# Patient Record
Sex: Male | Born: 1995 | Race: White | Hispanic: No | Marital: Married | Smoking: Former smoker
Health system: Southern US, Community
[De-identification: ages and names within clinical notes are randomized; demographics above are authoritative.]

## PROBLEM LIST (undated history)

## (undated) DIAGNOSIS — F419 Anxiety disorder, unspecified: Secondary | ICD-10-CM

## (undated) DIAGNOSIS — F319 Bipolar disorder, unspecified: Secondary | ICD-10-CM

## (undated) DIAGNOSIS — F909 Attention-deficit hyperactivity disorder, unspecified type: Secondary | ICD-10-CM

## (undated) HISTORY — DX: Attention-deficit hyperactivity disorder, unspecified type: F90.9

## (undated) HISTORY — PX: NO PAST SURGERIES: SHX2092

## (undated) HISTORY — DX: Anxiety disorder, unspecified: F41.9

---
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
## 2015-03-25 IMAGING — CR DG ABDOMEN 1V
1 series · 1 of 1 positions shown · non-contrast
Comparison: None.

CLINICAL DATA: Epigastric abdominal pain.

EXAM:
ABDOMEN - 1 VIEW

[dxr kidney ureter bladder]
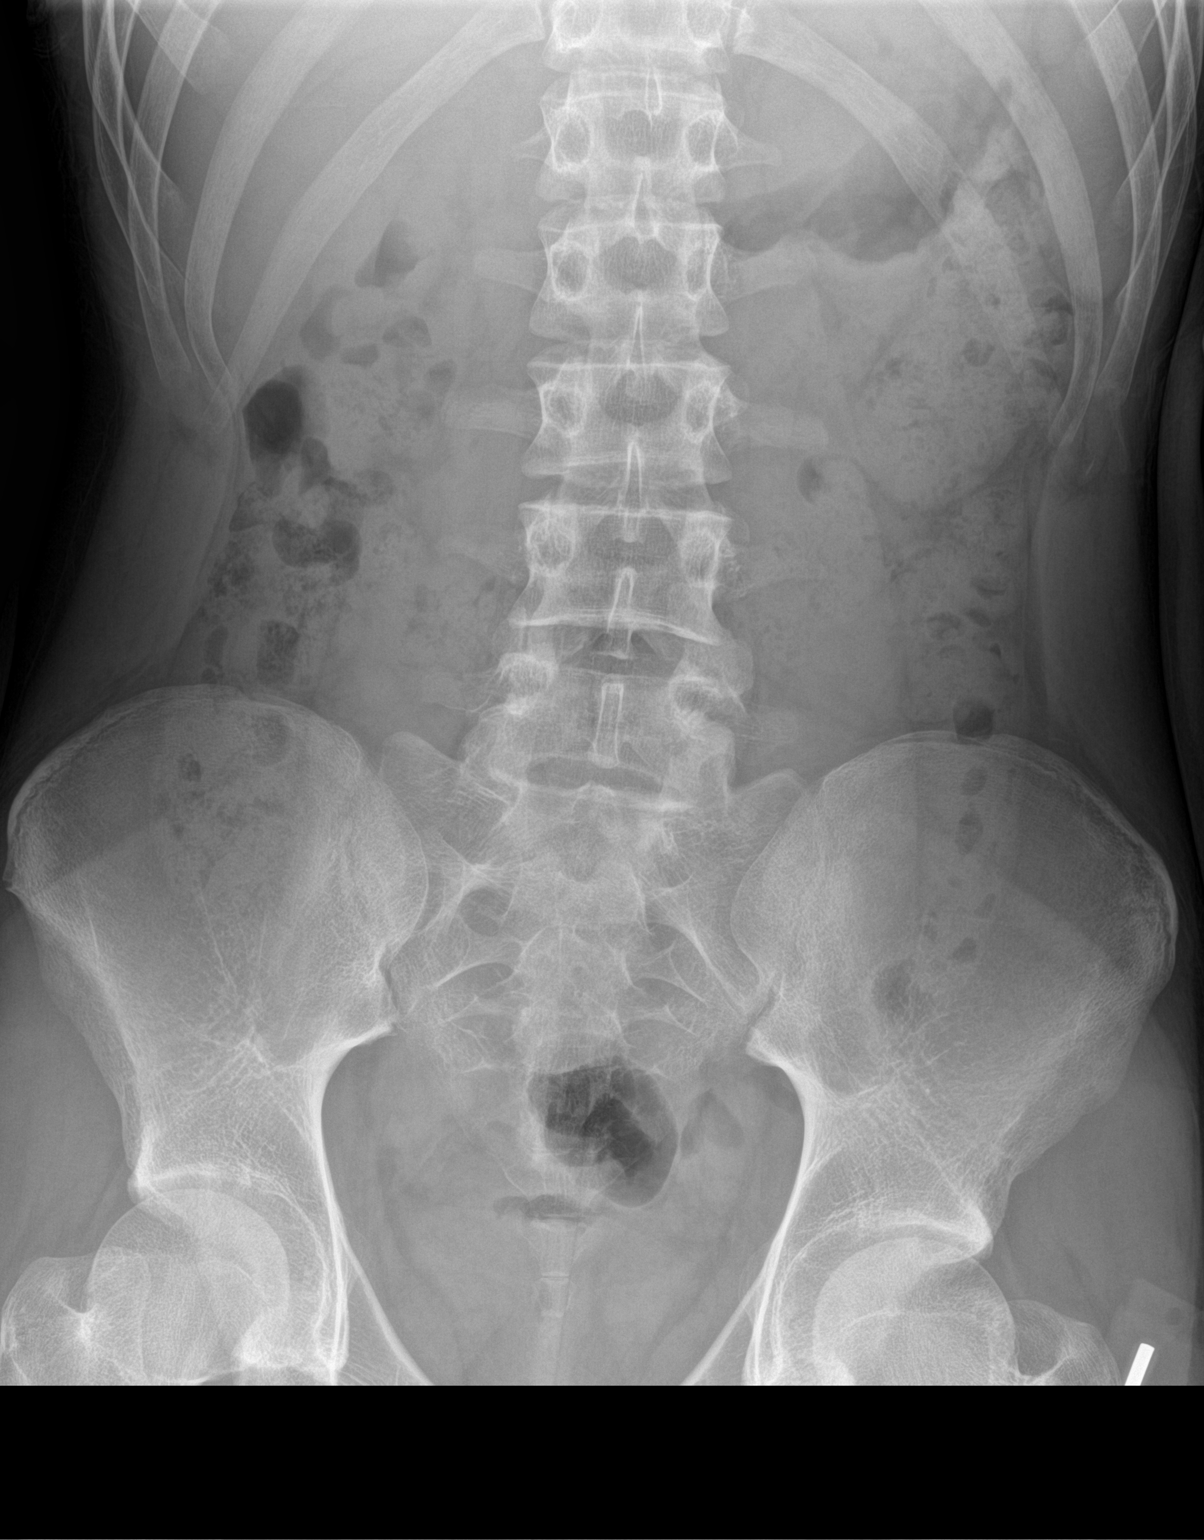

[1 of 1 positions shown; findings below may reference images not displayed]

FINDINGS: The bowel gas pattern is normal. No radio-opaque calculi or other
significant radiographic abnormality are seen. Moderate stool
burden.
IMPRESSION: Negative.

## 2016-04-19 ENCOUNTER — Ambulatory Visit
Admission: EM | Admit: 2016-04-19 | Discharge: 2016-04-19 | Disposition: A | Discharge status: Home / Self Care | Payer: 59 | Attending: Family Medicine | Admitting: Family Medicine

## 2016-04-19 DIAGNOSIS — J0100 Acute maxillary sinusitis, unspecified: Secondary | ICD-10-CM | POA: Diagnosis not present

## 2016-04-19 DIAGNOSIS — S71011A Laceration without foreign body, right hip, initial encounter: Secondary | ICD-10-CM | POA: Diagnosis not present

## 2016-04-19 HISTORY — DX: Bipolar disorder, unspecified: F31.9

## 2016-04-19 MED ORDER — TETANUS-DIPHTH-ACELL PERTUSSIS 5-2.5-18.5 LF-MCG/0.5 IM SUSP
0.5000 mL | Freq: Once | INTRAMUSCULAR | Status: AC
Start: 2016-04-19 — End: 2016-04-19
  Administered 2016-04-19: 0.5 mL via INTRAMUSCULAR

## 2016-04-19 MED ORDER — AMOXICILLIN 875 MG PO TABS: 875.0000 mg | ORAL_TABLET | Freq: Two times a day (BID) | ORAL | 0 refills | Status: DC

## 2016-04-19 NOTE — ED Provider Notes (Signed)
[facility]    CSN: [id number] Arrival date & time: 04/19/16  1349     History   Chief Complaint Chief Complaint  Patient presents with  . Puncture Wound  . Sinusitis    HPI Stephen Downs is a 21 y.o. male.   HPI  Past Medical History:  Diagnosis Date  . Bipolar 1 disorder (HCC)     There are no active problems to display for this patient.   Past Surgical History:  Procedure Laterality Date  . NO PAST SURGERIES         Home Medications    Prior to Admission medications   Medication Sig Start Date End Date Taking? Authorizing Provider  divalproex (DEPAKOTE) 500 MG DR tablet Take 1,000-1,500 mg by mouth every evening.   Yes Historical Provider, MD  guanFACINE (INTUNIV) 4 MG TB24 SR tablet Take 4 mg by mouth daily.   Yes Historical Provider, MD  lamoTRIgine (LAMICTAL) 150 MG tablet Take 150 mg by mouth daily.   Yes Historical Provider, MD  amoxicillin (AMOXIL) 875 MG tablet Take 1 tablet (875 mg total) by mouth 2 (two) times daily. 04/19/16   Payton Mccallum, MD  Multiple Vitamin (MULTIVITAMIN WITH MINERALS) TABS tablet Take 1 tablet by mouth daily.    Historical Provider, MD    Family History History reviewed. No pertinent family history.  Social History Social History  Substance Use Topics  . Smoking status: Current Every Day Smoker    Packs/day: 0.75    Types: Cigarettes  . Smokeless tobacco: Never Used  . Alcohol use Yes     Comment: occasionally     Allergies   Patient has no known allergies.   Review of Systems Review of Systems   Physical Exam Triage Vital Signs ED Triage Vitals  Enc Vitals Group     BP 04/19/16 1419 (!) 109/58     Pulse Rate 04/19/16 1419 71     Resp 04/19/16 1419 16     Temp 04/19/16 1419 98.7 F (37.1 C)     Temp Source 04/19/16 1419 Oral     SpO2 04/19/16 1419 100 %     Weight 04/19/16 1420 148 lb (67.1 kg)     Height 04/19/16 1420 5\' 11"  (1.803 m)     Head Circumference --      Peak Flow  --      Pain Score 04/19/16 1422 10     Pain Loc --      Pain Edu? --      Excl. in GC? --    No data found.   Updated Vital Signs BP (!) 109/58 (BP Location: Left Arm)   Pulse 71   Temp 98.7 F (37.1 C) (Oral)   Resp 16   Ht 5\' 11"  (1.803 m)   Wt 148 lb (67.1 kg)   SpO2 100%   BMI 20.64 kg/m   Visual Acuity Right Eye Distance:   Left Eye Distance:   Bilateral Distance:    Right Eye Near:   Left Eye Near:    Bilateral Near:     Physical Exam  Constitutional: He appears well-developed and well-nourished. No distress.  HENT:  Head: Normocephalic and atraumatic.  Right Ear: Tympanic membrane, external ear and ear canal normal.  Left Ear: Tympanic membrane, external ear and ear canal normal.  Nose: Right sinus exhibits maxillary sinus tenderness. Right sinus exhibits no frontal sinus tenderness. Left sinus exhibits maxillary sinus tenderness. Left sinus exhibits no frontal sinus tenderness.  Mouth/Throat: Uvula is midline, oropharynx is clear and moist and mucous membranes are normal. No oropharyngeal exudate or tonsillar abscesses.  Eyes: Conjunctivae and EOM are normal. Pupils are equal, round, and reactive to light. Right eye exhibits no discharge. Left eye exhibits no discharge. No scleral icterus.  Neck: Normal range of motion. Neck supple. No tracheal deviation present. No thyromegaly present.  Cardiovascular: Normal rate, regular rhythm and normal heart sounds.   Pulmonary/Chest: Effort normal and breath sounds normal. No stridor. No respiratory distress. He has no wheezes. He has no rales. He exhibits no tenderness.  Lymphadenopathy:    He has no cervical adenopathy.  Neurological: He is alert.  Skin: Skin is warm and dry. No rash noted. He is not diaphoretic.  1 cm laceration to skin on right hip  Nursing note and vitals reviewed.    UC Treatments / Results  Labs (all labs ordered are listed, but only abnormal results are displayed) Labs Reviewed - No data  to display  EKG  EKG Interpretation None       Radiology No results found.  Procedures .Marland Kitchen.Laceration Repair Date/Time: 04/19/2016 3:48 PM Performed by: Payton MccallumONTY, Phelan Goers Authorized by: Payton MccallumONTY, Raney Koeppen   Consent:    Consent obtained:  Verbal   Consent given by:  Patient   Risks discussed:  Infection, pain, poor wound healing, poor cosmetic result, need for additional repair and retained foreign body   Alternatives discussed:  No treatment Anesthesia (see MAR for exact dosages):    Anesthesia method:  Local infiltration   Local anesthetic:  Lidocaine 1% w/o epi Laceration details:    Location:  Leg   Leg location:  R hip   Length (cm):  1 Repair type:    Repair type:  Simple Pre-procedure details:    Preparation:  Patient was prepped and draped in usual sterile fashion Exploration:    Hemostasis achieved with:  Direct pressure   Wound exploration: wound explored through full range of motion and entire depth of wound probed and visualized     Wound extent: no foreign bodies/material noted, no muscle damage noted, no nerve damage noted, no tendon damage noted, no underlying fracture noted and no vascular damage noted     Contaminated: no   Treatment:    Area cleansed with:  Betadine   Amount of cleaning:  Standard   Irrigation solution:  Sterile saline   Irrigation method:  Pressure wash Skin repair:    Repair method:  Sutures   Suture size:  5-0   Suture material:  Nylon   Suture technique:  Simple interrupted   Number of sutures:  2 Approximation:    Approximation:  Close Post-procedure details:    Dressing:  Antibiotic ointment   Patient tolerance of procedure:  Tolerated well, no immediate complications    (including critical care time)  Medications Ordered in UC Medications  Tdap (BOOSTRIX) injection 0.5 mL (0.5 mLs Intramuscular Given 04/19/16 1423)     Initial Impression / Assessment and Plan / UC Course  I have reviewed the triage vital signs and the  nursing notes.  Pertinent labs & imaging results that were available during my care of the patient were reviewed by me and considered in my medical decision making (see chart for details).       Final Clinical Impressions(s) / UC Diagnoses   Final diagnoses:  Laceration of right hip, initial encounter  Acute maxillary sinusitis, recurrence not specified    New Prescriptions Discharge Medication List as of 04/19/2016  3:28 PM    START taking these medications   Details  amoxicillin (AMOXIL) 875 MG tablet Take 1 tablet (875 mg total) by mouth 2 (two) times daily., Starting Sun 04/19/2016, Normal       1. diagnosis reviewed with patient 2. rx as per orders above; reviewed possible side effects, interactions, risks and benefits  3. Recommend supportive treatment with wound/laceration care (verbal and written information given) 4. Follow-up in 8 days for suture removal or sooner prn if any problems 5.  Follow up prn if symptoms worsen or don't improve   Payton Mccallum, MD 04/19/16 1556

## 2016-04-19 NOTE — ED Triage Notes (Signed)
Patient complains of cough, congestion, sinus pain and pressure, discolored mucus x 2 weeks.  Patient also presents with puncture wound to right hip with a knife while trying to open something. Patient states that he believes a small piece of skin is missing.

## 2019-04-03 ENCOUNTER — Other Ambulatory Visit: Payer: Self-pay

## 2019-04-03 ENCOUNTER — Ambulatory Visit (INDEPENDENT_AMBULATORY_CARE_PROVIDER_SITE_OTHER): Payer: PRIVATE HEALTH INSURANCE | Admitting: Family Medicine

## 2019-04-03 VITALS — BP 117/75 | HR 87 | Temp 98.7°F | Ht 71.0 in | Wt 146.0 lb

## 2019-04-03 DIAGNOSIS — Z024 Encounter for examination for driving license: Secondary | ICD-10-CM

## 2019-04-03 NOTE — Patient Instructions (Addendum)
Nice meeting you today.  If you can bring the rest of the paperwork I will review and complete my portion of the form.  I do recommend discussing ADD and potential treatment if needed with your psychiatrist.   Schedule appointment at a time convenient for you for a physical or establish care visit.  I am happy to talk about your family history and any specific recommended tests.  Let me know if there are questions meantime.    If you have lab work done today you will be contacted with your lab results within the next 2 weeks.  If you have not heard from [place] then please contact us. The fastest way to get your results is to register for My Chart.   IF you received an x-ray today, you will receive an invoice from [facility]. Please contact [facility] at [phone] with questions or concerns regarding your invoice.   IF you received labwork today, you will receive an invoice from [facility]. Please contact [facility] at [phone] with questions or concerns regarding your invoice.   Our billing staff will not be able to assist you with questions regarding bills from these companies.  You will be contacted with the lab results as soon as they are available. The fastest way to get your results is to activate your My Chart account. Instructions are located on the last page of this paperwork. If you have not heard from [place] regarding the results in 2 weeks, please contact this office.
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
## 2019-04-04 ENCOUNTER — Encounter: Payer: Self-pay | Admitting: Family Medicine

## 2019-04-05 ENCOUNTER — Telehealth: Payer: Self-pay | Admitting: Family Medicine

## 2019-04-05 NOTE — Telephone Encounter (Signed)
Pt dropped off paperwork for [facility] Physical to be completed by Dr.Greene from Previous visit/ place in provider /cma mailbox at nurses station

## 2019-04-12 ENCOUNTER — Telehealth: Payer: Self-pay | Admitting: Family Medicine

## 2019-04-12 NOTE — Telephone Encounter (Signed)
Pt. Called about DMV paperwork he left for provider to fill out. Please give a call when it is ready for pick up. [phone] Please advise.

## 2019-04-12 NOTE — Telephone Encounter (Signed)
Please Advise

## 2019-04-12 NOTE — Telephone Encounter (Signed)
Paperwork completed - ready for pickup. Let me know if there are questions.

## 2019-04-13 NOTE — Telephone Encounter (Signed)
Msg was left on patient machine that paperwork is ready for pick up.

## 2020-06-21 ENCOUNTER — Other Ambulatory Visit (HOSPITAL_BASED_OUTPATIENT_CLINIC_OR_DEPARTMENT_OTHER)
Admission: RE | Admit: 2020-06-21 | Discharge: 2020-06-21 | Disposition: A | Discharge status: Home / Self Care | Payer: 59 | Source: Ambulatory Visit | Attending: Nurse Practitioner | Admitting: Nurse Practitioner

## 2020-06-21 ENCOUNTER — Encounter (HOSPITAL_BASED_OUTPATIENT_CLINIC_OR_DEPARTMENT_OTHER): Payer: Self-pay | Admitting: Nurse Practitioner

## 2020-06-21 ENCOUNTER — Other Ambulatory Visit: Payer: Self-pay

## 2020-06-21 ENCOUNTER — Ambulatory Visit (INDEPENDENT_AMBULATORY_CARE_PROVIDER_SITE_OTHER): Payer: 59 | Admitting: Nurse Practitioner

## 2020-06-21 VITALS — BP 99/63 | HR 85 | Ht 71.0 in | Wt 143.0 lb

## 2020-06-21 DIAGNOSIS — R42 Dizziness and giddiness: Secondary | ICD-10-CM

## 2020-06-21 DIAGNOSIS — Z130 Encounter for screening for diseases of the blood and blood-forming organs and certain disorders involving the immune mechanism: Secondary | ICD-10-CM

## 2020-06-21 DIAGNOSIS — R202 Paresthesia of skin: Secondary | ICD-10-CM

## 2020-06-21 DIAGNOSIS — R200 Anesthesia of skin: Secondary | ICD-10-CM | POA: Insufficient documentation

## 2020-06-21 DIAGNOSIS — Z820 Family history of epilepsy and other diseases of the nervous system: Secondary | ICD-10-CM

## 2020-06-21 DIAGNOSIS — Z13228 Encounter for screening for other metabolic disorders: Secondary | ICD-10-CM

## 2020-06-21 DIAGNOSIS — Z0000 Encounter for general adult medical examination without abnormal findings: Secondary | ICD-10-CM | POA: Insufficient documentation

## 2020-06-21 DIAGNOSIS — Z131 Encounter for screening for diabetes mellitus: Secondary | ICD-10-CM

## 2020-06-21 DIAGNOSIS — Z13220 Encounter for screening for lipoid disorders: Secondary | ICD-10-CM

## 2020-06-21 DIAGNOSIS — Z7689 Persons encountering health services in other specified circumstances: Secondary | ICD-10-CM

## 2020-06-21 DIAGNOSIS — Z1321 Encounter for screening for nutritional disorder: Secondary | ICD-10-CM

## 2020-06-21 DIAGNOSIS — Z87898 Personal history of other specified conditions: Secondary | ICD-10-CM

## 2020-06-21 DIAGNOSIS — Z1329 Encounter for screening for other suspected endocrine disorder: Secondary | ICD-10-CM

## 2020-06-21 HISTORY — DX: Persons encountering health services in other specified circumstances: Z76.89

## 2020-06-21 HISTORY — DX: Dizziness and giddiness: R42

## 2020-06-21 HISTORY — DX: Anesthesia of skin: R20.0

## 2020-06-21 HISTORY — DX: Encounter for general adult medical examination without abnormal findings: Z00.00

## 2020-06-21 HISTORY — DX: Personal history of other specified conditions: Z87.898

## 2020-06-21 HISTORY — DX: Family history of epilepsy and other diseases of the nervous system: Z82.0

## 2020-06-21 LAB — LIPID PANEL
Cholesterol: 150 mg/dL (ref 0–200)
HDL: 49 mg/dL (ref >40)
LDL Cholesterol: 83 mg/dL (ref 0–99)
Total CHOL/HDL Ratio: 3.1 RATIO
Triglycerides: 89 mg/dL (ref <150)
VLDL: 18 mg/dL (ref 0–40)

## 2020-06-21 LAB — CBC WITH DIFFERENTIAL/PLATELET
Abs Immature Granulocytes: 0.01 10*3/uL (ref 0.00–0.07)
Basophils Absolute: 0.1 10*3/uL (ref 0.0–0.1)
Basophils Relative: 1 %
Eosinophils Absolute: 0.1 10*3/uL (ref 0.0–0.5)
Eosinophils Relative: 1 %
HCT: 47.2 % (ref 39.0–52.0)
Hemoglobin: 16.7 g/dL (ref 13.0–17.0)
Immature Granulocytes: 0 %
Lymphocytes Relative: 39 %
Lymphs Abs: 2.8 10*3/uL (ref 0.7–4.0)
MCH: 30.6 pg (ref 26.0–34.0)
MCHC: 35.4 g/dL (ref 30.0–36.0)
MCV: 86.6 fL (ref 80.0–100.0)
Monocytes Absolute: 0.5 10*3/uL (ref 0.1–1.0)
Monocytes Relative: 7 %
Neutro Abs: 3.8 10*3/uL (ref 1.7–7.7)
Neutrophils Relative %: 52 %
Platelets: 275 10*3/uL (ref 150–400)
RBC: 5.45 MIL/uL (ref 4.22–5.81)
RDW: 11.8 % (ref 11.5–15.5)
WBC: 7.2 10*3/uL (ref 4.0–10.5)
nRBC: 0 % (ref 0.0–0.2)

## 2020-06-21 LAB — COMPREHENSIVE METABOLIC PANEL
ALT: 13 U/L (ref 0–44)
AST: 19 U/L (ref 15–41)
Albumin: 4.9 g/dL (ref 3.5–5.0)
Alkaline Phosphatase: 53 U/L (ref 38–126)
Anion gap: 9 (ref 5–15)
BUN: 8 mg/dL (ref 6–20)
CO2: 28 mmol/L (ref 22–32)
Calcium: 9.8 mg/dL (ref 8.9–10.3)
Chloride: 102 mmol/L (ref 98–111)
Creatinine, Ser: 0.95 mg/dL (ref 0.61–1.24)
GFR, Estimated: >60 mL/min (ref >60)
Glucose, Bld: 88 mg/dL (ref 70–99)
Potassium: 3.8 mmol/L (ref 3.5–5.1)
Sodium: 139 mmol/L (ref 135–145)
Total Bilirubin: 1.2 mg/dL (ref 0.3–1.2)
Total Protein: 7.7 g/dL (ref 6.5–8.1)

## 2020-06-21 NOTE — Assessment & Plan Note (Signed)
Reported family medical history of CMT in paternal grandfather and 2 paternal uncles.  He reports that his father does not have this condition.  He has noted worsening neurological symptoms that have been developing over the past several years and seem to be progressively increasing in frequency and intensity. Discussed with the patient neurology referral versus genetic counseling referral.  He reports that he does not have a preference at this time. Joint decision was made to start neurology referral for evaluation and further recommendations for testing and treatment measures. No significant weakness or deficits noted on evaluation today.  He does report that his symptoms are intermittent at this time. Will obtain labs today to rule out possible thyroid, B12, vitamin D dysfunction that could possibly cause some of the symptoms that he is experiencing. Referral placed for neurology.

## 2020-06-21 NOTE — Assessment & Plan Note (Signed)
Remote history of syncopal episode upon standing with reported head injury.  There are no injuries present today and this is not a recurrent issue however he is having issues with positional lightheadedness occurring while stretching.  No deficits noted on evaluation today.  Referral for cardiology was placed for further evaluation.  Blood pressure is borderline today. Recommendations for increased hydration and sodium intake to keep blood pressure up and to monitor for symptoms of postural hypotension.

## 2020-06-21 NOTE — Assessment & Plan Note (Signed)
Chart review and review of past medical history, current medical history, family medical history, social determinants of health completed today. Will further review medical records to evaluate for any care gaps that need to be addressed and follow-up with patient at CPE.

## 2020-06-21 NOTE — Patient Instructions (Addendum)
I have placed an order for cardiology for the lightheadedness that you are experiencing. They can best evaluate if this is something going on with your heart or blood pressure.   I have also placed the referral for neurology. If they determine that genetic testing is more appropriate, I will let you know.   I have placed orders for labs. You can have those done today.   If you want to schedule a follow-up for exam and to go over these results you can do this as you leave today.   Let us know if you have any questions or concerns.   Thank you for choosing [place] at [facility] for your Primary Care needs. I am excited for the opportunity to partner with you to meet your health care goals. It was a pleasure meeting you today!  I am an Adult-Geriatric Nurse Practitioner with a background in caring for patients for more than 20 years. I received my Paediatric nurse in Nursing and my Doctor of Nursing Practice degrees at [employer]. I received additional fellowship training in primary care and sports medicine after receiving my doctorate degree. I provide primary care and sports medicine services to patients age 25 and older within this office. I am also a provider with the [facility] and the director of the APP Fellowship with [facility].  I am a [place] native, but have called the [place] area home for nearly 20 years and am proud to be a member of this community.   I am passionate about providing the best service to you through preventive medicine and supportive care. I consider you a part of the medical team and value your input. I work diligently to ensure that you are heard and your needs are met in a safe and effective manner. I want you to feel comfortable with me as your provider and want you to know that your health concerns are important to me.   For your information, our office hours are Monday- Friday 8:00 AM - 5:00  PM At this time I am not in the office on Wednesdays.  If you have questions or concerns, please call our office at [phone] or send [place] a MyChart message and we will respond as quickly as possible.   For all urgent or time sensitive needs we ask that you please call the office to avoid delays. MyChart is not constantly monitored and replies may take up to 72 business hours.  MyChart Policy: . MyChart allows for you to see your visit notes, after visit summary, provider recommendations, lab and tests results, make an appointment, request refills, and contact your provider or the office for non-urgent questions or concerns.  . Providers are seeing patients during normal business hours and do not have built in time to review MyChart messages. We ask that you allow a minimum of 72 business hours for MyChart message responses.  . Complex MyChart concerns may require a visit. Your provider may request you schedule a virtual or in person visit to ensure we are providing the best care possible. . MyChart messages sent after 4:00 PM on Friday will not be received by the provider until Monday morning.    Lab and Test Results: . You will receive your lab and test results on MyChart as soon as they are completed and results have been sent by the lab or testing facility. Due to this service, you will receive your results BEFORE your  provider.  . Please allow a minimum of 72 business hours for your provider to receive and review lab and test results and contact you about.   . Most lab and test result comments from the provider will be sent through [place]. Your provider may recommend changes to the plan of care, follow-up visits, repeat testing, ask questions, or request an office visit to discuss these results. You may reply directly to this message or call the office at [phone] to provide information for the provider or set up an appointment. . In some instances, you will be called with test results  and recommendations. Please let us know if this is preferred and we will make note of this in your chart to provide this for you.    . If you have not heard a response to your lab or test results in 72 business hours, please call the office to let us know.   After Hours: . For all non-emergency after hours needs, please call the office at [phone] and select the option to reach the on-call provider service. On-call services are shared between multiple [facility] offices and therefore it will not be possible to speak directly with your provider. On-call providers may provide medical advice and recommendations, but are unable to provide refills for maintenance medications.  . For all emergency or urgent medical needs after normal business hours, we recommend that you seek care at the closest Urgent Care or Emergency Department to ensure appropriate treatment in a timely manner.  [facility] has a 24 hour emergency room located on the ground floor for your convenience.    Please do not hesitate to reach out to [place] with concerns.   Thank you, again, for choosing me as your health care partner. I appreciate your trust and look forward to learning more about you.   Worthy Keeler, DNP, AGNP-c _____________________________________________________

## 2020-06-21 NOTE — Assessment & Plan Note (Signed)
Bilateral numbness and tingling of the arms and legs with a family history positive for CMT syndrome. Laboratory testing today ordered for common medical conditions that could cause similar symptoms.  Referral placed for neurology for further evaluation and recommendations for genetic testing and treatment for CMT if present.

## 2020-06-21 NOTE — Assessment & Plan Note (Signed)
Laboratory tests ordered today for upcoming CPE.

## 2020-06-21 NOTE — Progress Notes (Signed)
New Patient Office Visit  Subjective:  Patient ID: Stephen Downs, male    DOB: May 15, 1995  Age: 25 y.o. MRN: [id number]  CC:  Chief Complaint  Patient presents with  . Establish Care    HPI Stephen Downs is a 25 year old male presenting today to establish care with this practice.  He has previously seen by Dr. Chilton [facility].  He has not had a annual physical exam since 2015 or 2016.  He reports a medical history positive for bipolar 1 disorder he is currently being by [facility] to manage his care and medications.  He reports he is doing well on his current medication regimen of lamotrigine 150 mg/day.  He expresses concern today for family medical history of Charcot-Marie-Tooth Syndrome in his paternal grandfather and 2 paternal uncles.  He reports that all 3 were diagnosed in their mid 60s and over the past year he has noticed progressively worsening symptoms with signs of loss of balance, burning and tingling in his fingers, soreness in his hands, "strange sensation" in his toes, and weakness when holding things for long periods in his hands.  He denies any falls but reports he has caught himself a few times due to loss of balance.  He also endorses high arches in the presence of hammertoes.  He would like to receive a referral today for testing for this disorder for himself.  He also expresses concern today with intermittent lightheadedness that occurs when stretching and popping his sternum.  He reports that several years ago he received an injury to the sternum and since that time he intermittently feels the need to "pop" the area due to discomfort.  He reports that intermittently while stretching and arching his back to create the popping sensation he will experience a brief period of lightheadedness and dizziness.  He denies any chest pain, shortness of breath, palpitations, loss of consciousness during this time.  He also endorses 1 episode of loss of  consciousness with a fall and hitting his head approximately 2 years ago.  He reports that he believes he stood up too quickly which is what facilitated this.  He has not had any recurrent episodes since that time. He does not have any known cardiac history.  Past Medical History:  Diagnosis Date  . Bipolar 1 disorder [facility])     Past Surgical History:  Procedure Laterality Date  . NO PAST SURGERIES      History reviewed. No pertinent family history.  Social History   Socioeconomic History  . Marital status: Single    Spouse name: Not on file  . Number of children: Not on file  . Years of education: Not on file  . Highest education level: Not on file  Occupational History  . Not on file  Tobacco Use  . Smoking status: Current Every Day Smoker    Packs/day: 0.75    Types: Cigarettes  . Smokeless tobacco: Never Used  Substance and Sexual Activity  . Alcohol use: Yes    Comment: occasionally  . Drug use: No  . Sexual activity: Not on file  Other Topics Concern  . Not on file  Social History Narrative  . Not on file   Social Determinants of Health   Financial Resource Strain: Not on file  Food Insecurity: Not on file  Transportation Needs: Not on file  Physical Activity: Not on file  Stress: Not on file  Social Connections: Not on file  Intimate Partner Violence:  Not on file    ROS Review of Systems  Constitutional: Negative for chills, fever and unexpected weight change.  Eyes: Negative for photophobia and visual disturbance.  Respiratory: Negative for cough, chest tightness, shortness of breath and wheezing.   Cardiovascular: Negative for chest pain, palpitations and leg swelling.  Endocrine: Negative for cold intolerance and heat intolerance.  Musculoskeletal: Negative for arthralgias, gait problem and myalgias.  Skin: Negative for color change, pallor, rash and wound.  Neurological: Positive for dizziness, weakness, light-headedness and numbness. Negative for  tremors, syncope, facial asymmetry, speech difficulty and headaches.  Psychiatric/Behavioral: Negative for dysphoric mood, self-injury and sleep disturbance. The patient is not nervous/anxious.     Objective:   Today's Vitals: BP 99/63   Pulse 85   Ht 5\' 11"  (1.803 m)   Wt 143 lb (64.9 kg)   SpO2 98%   BMI 19.94 kg/m   Physical Exam Vitals and nursing note reviewed.  Constitutional:      Appearance: Normal appearance.  HENT:     Head: Normocephalic and atraumatic.     Right Ear: Tympanic membrane, ear canal and external ear normal.     Left Ear: Tympanic membrane, ear canal and external ear normal.     Nose: Nose normal.     Mouth/Throat:     Mouth: Mucous membranes are moist.     Pharynx: Oropharynx is clear.  Eyes:     Extraocular Movements: Extraocular movements intact.     Conjunctiva/sclera: Conjunctivae normal.     Pupils: Pupils are equal, round, and reactive to light.  Neck:     Vascular: No carotid bruit.  Cardiovascular:     Rate and Rhythm: Normal rate and regular rhythm.     Pulses: Normal pulses.     Heart sounds: Normal heart sounds. No murmur heard.   Pulmonary:     Effort: Pulmonary effort is normal.     Breath sounds: Normal breath sounds.  Abdominal:     General: Abdomen is flat. Bowel sounds are normal. There is no distension.     Palpations: Abdomen is soft.     Tenderness: There is no abdominal tenderness. There is no right CVA tenderness, left CVA tenderness, guarding or rebound.  Musculoskeletal:        General: Normal range of motion.     Cervical back: Normal range of motion and neck supple.     Right lower leg: No edema.     Left lower leg: No edema.  Lymphadenopathy:     Cervical: No cervical adenopathy.  Skin:    General: Skin is warm and dry.     Capillary Refill: Capillary refill takes less than 2 seconds.  Neurological:     General: No focal deficit present.     Mental Status: He is alert and oriented to person, place, and time.      Cranial Nerves: No cranial nerve deficit.     Sensory: No sensory deficit.     Motor: No weakness.     Coordination: Coordination normal.     Gait: Gait normal.  Psychiatric:        Mood and Affect: Mood normal.        Behavior: Behavior normal.        Thought Content: Thought content normal.        Judgment: Judgment normal.     Assessment & Plan:   Problem List Items Addressed This Visit      Other   Encounter to establish care -  Primary    Chart review and review of past medical history, current medical history, family medical history, social determinants of health completed today. Will further review medical records to evaluate for any care gaps that need to be addressed and follow-up with patient at CPE.      Family history of Charcot-Marie-Tooth disease    Reported family medical history of CMT in paternal grandfather and 2 paternal uncles.  He reports that his father does not have this condition.  He has noted worsening neurological symptoms that have been developing over the past several years and seem to be progressively increasing in frequency and intensity. Discussed with the patient neurology referral versus genetic counseling referral.  He reports that he does not have a preference at this time. Joint decision was made to start neurology referral for evaluation and further recommendations for testing and treatment measures. No significant weakness or deficits noted on evaluation today.  He does report that his symptoms are intermittent at this time. Will obtain labs today to rule out possible thyroid, B12, vitamin D dysfunction that could possibly cause some of the symptoms that he is experiencing. Referral placed for neurology.      Relevant Orders   Ambulatory referral to Neurology   Bilateral numbness and tingling of arms and legs    Bilateral numbness and tingling of the arms and legs with a family history positive for CMT syndrome. Laboratory testing today  ordered for common medical conditions that could cause similar symptoms.  Referral placed for neurology for further evaluation and recommendations for genetic testing and treatment for CMT if present.      Relevant Orders   Ambulatory referral to Neurology   Comprehensive metabolic panel (Completed)   B12 and Folate Panel   Thyroid Panel With TSH   Positional lightheadedness    Reported symptoms of positional lightheadedness most notably when stretching and arching back with a personal history of syncope.  No known cardiac history however given the presence of the current symptoms recommend evaluation by cardiology to rule out cardiac dysfunction.  No symptoms present today.  Blood pressure is a little on the lower side.  Discussed with patient the importance of maintaining hydration and good nutrition. If symptoms return recommend follow-up or evaluation immediately.      Relevant Orders   CBC w/Diff/Platelet (Completed)   Ambulatory referral to Cardiology   History of syncope    Remote history of syncopal episode upon standing with reported head injury.  There are no injuries present today and this is not a recurrent issue however he is having issues with positional lightheadedness occurring while stretching.  No deficits noted on evaluation today.  Referral for cardiology was placed for further evaluation.  Blood pressure is borderline today. Recommendations for increased hydration and sodium intake to keep blood pressure up and to monitor for symptoms of postural hypotension.      Relevant Orders   Ambulatory referral to Cardiology   Laboratory tests ordered as part of a complete physical exam (CPE)    Laboratory tests ordered today for upcoming CPE.      Relevant Orders   CBC w/Diff/Platelet (Completed)   Comprehensive metabolic panel (Completed)   Lipid panel   B12 and Folate Panel   Thyroid Panel With TSH      Outpatient Encounter Medications as of 06/21/2020  Medication  Sig  . lamoTRIgine (LAMICTAL) 150 MG tablet Take 150 mg by mouth daily.  . [DISCONTINUED] divalproex (DEPAKOTE) 500 MG DR tablet  Take 1,000-1,500 mg by mouth every evening.  . [DISCONTINUED] guanFACINE (INTUNIV) 4 MG TB24 ER tablet Take 4 mg by mouth daily.  . [DISCONTINUED] Multiple Vitamin (MULTIVITAMIN WITH MINERALS) TABS tablet Take 1 tablet by mouth daily.  . [DISCONTINUED] amoxicillin (AMOXIL) 875 MG tablet Take 1 tablet (875 mg total) by mouth 2 (two) times daily. (Patient not taking: Reported on 04/03/2019)   No facility-administered encounter medications on file as of 06/21/2020.    Follow-up: Return for CPE and go over labs in next few weeks- lab appt today.   Tollie Eth, NP

## 2020-06-21 NOTE — Assessment & Plan Note (Signed)
Reported symptoms of positional lightheadedness most notably when stretching and arching back with a personal history of syncope.  No known cardiac history however given the presence of the current symptoms recommend evaluation by cardiology to rule out cardiac dysfunction.  No symptoms present today.  Blood pressure is a little on the lower side.  Discussed with patient the importance of maintaining hydration and good nutrition. If symptoms return recommend follow-up or evaluation immediately.

## 2020-06-22 LAB — THYROID PANEL WITH TSH
Free Thyroxine Index: 2.6 (ref 1.2–4.9)
T3 Uptake Ratio: 30 % (ref 24–39)
T4, Total: 8.6 ug/dL (ref 4.5–12.0)
TSH: 1.53 u[IU]/mL (ref 0.450–4.500)

## 2020-06-28 ENCOUNTER — Telehealth (HOSPITAL_BASED_OUTPATIENT_CLINIC_OR_DEPARTMENT_OTHER): Payer: Self-pay

## 2020-06-28 NOTE — Telephone Encounter (Signed)
-----   Message from Tollie Eth, NP sent at 06/26/2020  6:41 PM EDT -----   All labs looked great. No signs of any abnormalities present.

## 2020-06-28 NOTE — Telephone Encounter (Signed)
Results released by Sarabeth Early, AGNP and reviewed by patient via MyChart. Instructed patient to contact the office with any questions or concerns.  

## 2020-07-11 DIAGNOSIS — F319 Bipolar disorder, unspecified: Secondary | ICD-10-CM | POA: Insufficient documentation

## 2020-07-12 ENCOUNTER — Ambulatory Visit (INDEPENDENT_AMBULATORY_CARE_PROVIDER_SITE_OTHER): Payer: 59 | Admitting: Nurse Practitioner

## 2020-07-12 ENCOUNTER — Other Ambulatory Visit: Payer: Self-pay

## 2020-07-12 VITALS — BP 102/53 | HR 74 | Ht 71.0 in | Wt 144.8 lb

## 2020-07-12 DIAGNOSIS — F319 Bipolar disorder, unspecified: Secondary | ICD-10-CM

## 2020-07-12 DIAGNOSIS — Z820 Family history of epilepsy and other diseases of the nervous system: Secondary | ICD-10-CM | POA: Diagnosis not present

## 2020-07-12 DIAGNOSIS — F902 Attention-deficit hyperactivity disorder, combined type: Secondary | ICD-10-CM

## 2020-07-12 DIAGNOSIS — R202 Paresthesia of skin: Secondary | ICD-10-CM

## 2020-07-12 DIAGNOSIS — Z0000 Encounter for general adult medical examination without abnormal findings: Secondary | ICD-10-CM

## 2020-07-12 DIAGNOSIS — R200 Anesthesia of skin: Secondary | ICD-10-CM | POA: Diagnosis not present

## 2020-07-12 DIAGNOSIS — Z87820 Personal history of traumatic brain injury: Secondary | ICD-10-CM

## 2020-07-12 NOTE — Patient Instructions (Signed)
I will give you a call about the medication after I have looked a few things up.   I will also see if we can get you in with a neurologist a little sooner.   Let me know if you need anything or have any concerns.    Health Maintenance, Male Adopting a healthy lifestyle and getting preventive care are important in promoting health and wellness. Ask your health care provider about:  The right schedule for you to have regular tests and exams.  Things you can do on your own to prevent diseases and keep yourself healthy. What should I know about diet, weight, and exercise? Eat a healthy diet  Eat a diet that includes plenty of vegetables, fruits, low-fat dairy products, and lean protein.  Do not eat a lot of foods that are high in solid fats, added sugars, or sodium.   Maintain a healthy weight Body mass index (BMI) is a measurement that can be used to identify possible weight problems. It estimates body fat based on height and weight. Your health care provider can help determine your BMI and help you achieve or maintain a healthy weight. Get regular exercise Get regular exercise. This is one of the most important things you can do for your health. Most adults should:  Exercise for at least 150 minutes each week. The exercise should increase your heart rate and make you sweat (moderate-intensity exercise).  Do strengthening exercises at least twice a week. This is in addition to the moderate-intensity exercise.  Spend less time sitting. Even light physical activity can be beneficial. Watch cholesterol and blood lipids Have your blood tested for lipids and cholesterol at 25 years of age, then have this test every 5 years. You may need to have your cholesterol levels checked more often if:  Your lipid or cholesterol levels are high.  You are older than 25 years of age.  You are at high risk for heart disease. What should I know about cancer screening? Many types of cancers can be  detected [place] and may often be prevented. Depending on your health history and family history, you may need to have cancer screening at various ages. This may include screening for:  Colorectal cancer.  Prostate cancer.  Skin cancer.  Lung cancer. What should I know about heart disease, diabetes, and high blood pressure? Blood pressure and heart disease  High blood pressure causes heart disease and increases the risk of stroke. This is more likely to develop in people who have high blood pressure readings, are of African descent, or are overweight.  Talk with your health care provider about your target blood pressure readings.  Have your blood pressure checked: ? Every 3-5 years if you are 25-25 years of age. ? Every year if you are 25 years old or older.  If you are between the ages of 36 and 37 and are a current or former smoker, ask your health care provider if you should have a one-time screening for abdominal aortic aneurysm (AAA). Diabetes Have regular diabetes screenings. This checks your fasting blood sugar level. Have the screening done:  Once every three years after age 12 if you are at a normal weight and have a low risk for diabetes.  More often and at a younger age if you are overweight or have a high risk for diabetes. What should I know about preventing infection? Hepatitis B If you have a higher risk for hepatitis B, you should be screened for this virus.  Talk with your health care provider to find out if you are at risk for hepatitis B infection. Hepatitis C Blood testing is recommended for:  Everyone born from [birth year] through 1965.  Anyone with known risk factors for hepatitis C. Sexually transmitted infections (STIs)  You should be screened each year for STIs, including gonorrhea and chlamydia, if: ? You are sexually active and are younger than 25 years of age. ? You are older than 25 years of age and your health care provider tells you that you are at risk  for this type of infection. ? Your sexual activity has changed since you were last screened, and you are at increased risk for chlamydia or gonorrhea. Ask your health care provider if you are at risk.  Ask your health care provider about whether you are at high risk for HIV. Your health care provider may recommend a prescription medicine to help prevent HIV infection. If you choose to take medicine to prevent HIV, you should first get tested for HIV. You should then be tested every 3 months for as long as you are taking the medicine. Follow these instructions at home: Lifestyle  Do not use any products that contain nicotine or tobacco, such as cigarettes, e-cigarettes, and chewing tobacco. If you need help quitting, ask your health care provider.  Do not use street drugs.  Do not share needles.  Ask your health care provider for help if you need support or information about quitting drugs. Alcohol use  Do not drink alcohol if your health care provider tells you not to drink.  If you drink alcohol: ? Limit how much you have to 0-2 drinks a day. ? Be aware of how much alcohol is in your drink. In the [place] one drink equals one 12 oz bottle of beer (355 mL), one 5 oz glass of wine (148 mL), or one 1 oz glass of hard liquor (44 mL). General instructions  Schedule regular health, dental, and eye exams.  Stay current with your vaccines.  Tell your health care provider if: ? You often feel depressed. ? You have ever been abused or do not feel safe at home. Summary  Adopting a healthy lifestyle and getting preventive care are important in promoting health and wellness.  Follow your health care provider's instructions about healthy diet, exercising, and getting tested or screened for diseases.  Follow your health care provider's instructions on monitoring your cholesterol and blood pressure. This information is not intended to replace advice given to you by your health care provider.  Make sure you discuss any questions you have with your health care provider. Document Revised: 02/09/2018 Document Reviewed: 02/09/2018 [employer] Patient Education  2021 [employer].

## 2020-07-12 NOTE — Progress Notes (Signed)
BP (!) 102/53   Pulse 74   Ht 5\' 11"  (1.803 m)   Wt 144 lb 12.8 oz (65.7 kg)   SpO2 99%   BMI 20.20 kg/m    Subjective:    Patient ID: , male    DOB: 14-Feb-1996, 25 y.o.   MRN: [id number]  HPI: Stephen Downs is a 25 y.o. male presenting on 07/12/2020 for comprehensive medical examination.   Current medical concerns include NEURO continued symptoms of tingling in hands and feet. Occurence yesterday was a little worse than it has been in the past. Has a referral to neurology- appt in July. Would like to see if he can get in somewhere else sooner.  Grandfather saw Dr. August with [facility].  Concerned with history of "a lot" of concussions from playing sports and "doing stupid things" as a kid. Believes concussions were mild. No reported loss of consciousness or prolonged symptoms. Given symptoms, not sure if there is correlation with this.  No recent head trauma, loss of consciousness, weakness, vision changes, or prolonged headaches.  Issues with forgetfulness and attention.  Dx with ADHD as a child, but not sure if correlated Wants to know if imaging should be done  ADHD ADHD diagnosed as a child by psychiatry (Dr. [facility]) States current psychiatrist (Dr. Tomasa Rand- [facility] in [place]) does not feel he has ADHD. No recent testing done. Symptoms of restlessness, forgetfulness, fidgeting, "spacing out" during conversations, easily distracted.  Previously on Intunive while on Lamictal and Depakote for bipolar d/o Interested in evaluation for possible medication In school online and finding it difficult to concentrate and stay focused. Recently lost job- unsure if this is correlated.  Endorses problems lifelong- significant effect on social functioning and performance.   Past Medical History:  Past Medical History:  Diagnosis Date  . ADHD   . Bilateral numbness and tingling of arms and legs 06/21/2020  . Bipolar 1 disorder  (HCC)   . Encounter to establish care 06/21/2020  . Family history of Charcot-Marie-Tooth disease 06/21/2020  . History of syncope 06/21/2020  . Laboratory tests ordered as part of a complete physical exam (CPE) 06/21/2020  . Positional lightheadedness 06/21/2020    Medications:  Current Outpatient Medications on File Prior to Visit  Medication Sig  . lamoTRIgine (LAMICTAL) 150 MG tablet Take 150 mg by mouth daily.   No current facility-administered medications on file prior to visit.   Interim Problems from his last visit: no  He reports regular vision exams q1-5y: no He reports regular dental exams q 14m: no His diet consists of: mixture of healthy and unhealthy He endorses exercise and/or activity of: cardio activity several days a week with pushups, situps, pullup- working to increase He works at: [employer] and in school currently  He endorses ETOH use: one or two drinks once or twice a month He endorses nictoine use: vaping throughout the day- not sure of amount He endorses illegal substance use: marijuana twice a day  He is single partner and has sex with females  He denies concerns today about STI  He denies concerns about skin changes today:  He denies concerns about bowel changes today:  He denies concerns about bladder changes today:   Depression Screen done today and results listed below:  Depression screen [facility] 2/9 06/21/2020 06/21/2020 04/03/2019  Decreased Interest 0 0 0  Down, Depressed, Hopeless 0 0 0  PHQ - 2 Score 0 0 0  Altered sleeping 0 - -  Tired, decreased energy 0 - -  Change in appetite 0 - -  Feeling bad or failure about yourself  0 - -  Trouble concentrating 1 - -  Moving slowly or fidgety/restless 1 - -  Suicidal thoughts 0 - -  PHQ-9 Score 2 - -   Anxiety Screen done GAD 7 : Generalized Anxiety Score 06/21/2020  Nervous, Anxious, on Edge 1  Control/stop worrying 1  Worry too much - different things 0  Trouble relaxing 0  Restless 1  Easily  annoyed or irritable 1  Afraid - awful might happen 0  Total GAD 7 Score 4  Anxiety Difficulty Not difficult at all    The patient does not have a history of falls. I did complete a risk assessment for falls. A plan of care for falls was documented. Fall Risk  06/21/2020 04/03/2019  Falls in the past year? 0 0  Number falls in past yr: 0 0  Injury with Fall? - 0  Risk for fall due to : No Fall Risks -     Surgical History:  Past Surgical History:  Procedure Laterality Date  . NO PAST SURGERIES      Allergies:  No Known Allergies  Social History:  Social History   Socioeconomic History  . Marital status: Single    Spouse name: Not on file  . Number of children: Not on file  . Years of education: Not on file  . Highest education level: Not on file  Occupational History  . Not on file  Tobacco Use  . Smoking status: Former Smoker    Packs/day: 0.75    Types: Cigarettes  . Smokeless tobacco: Never Used  Vaping Use  . Vaping Use: Every day  . Substances: Nicotine, CBD  Substance and Sexual Activity  . Alcohol use: Yes    Comment: occasionally  . Drug use: Yes    Frequency: 14.0 times per week    Types: Marijuana    Comment: twice a day use  . Sexual activity: Yes    Birth control/protection: Pill  Other Topics Concern  . Not on file  Social History Narrative  . Not on file   Social Determinants of Health   Financial Resource Strain: Not on file  Food Insecurity: Not on file  Transportation Needs: Not on file  Physical Activity: Insufficiently Active  . Days of Exercise per Week: 3 days  . Minutes of Exercise per Session: 30 min  Stress: Not on file  Social Connections: Not on file  Intimate Partner Violence: Not At Risk  . Fear of Current or Ex-Partner: No  . Emotionally Abused: No  . Physically Abused: No  . Sexually Abused: No   Social History   Tobacco Use  Smoking Status Former Smoker  . Packs/day: 0.75  . Types: Cigarettes  Smokeless Tobacco  Never Used   Social History   Substance and Sexual Activity  Alcohol Use Yes   Comment: occasionally    Family History:  Family History  Problem Relation Age of Onset  . Stroke Father   . Charcot-Marie-Tooth disease Paternal Uncle   . Thyroid cancer Maternal Grandmother   . Charcot-Marie-Tooth disease Maternal Grandfather   . Cerebral aneurysm Maternal Grandfather   . Prostate cancer Maternal Great-grandfather     Past medical history, surgical history, medications, allergies, family history and social history reviewed with patient today and changes made to appropriate areas of the chart.   All ROS negative except what is listed above  and in the HPI.      Objective:    BP (!) 102/53   Pulse 74   Ht 5\' 11"  (1.803 m)   Wt 144 lb 12.8 oz (65.7 kg)   SpO2 99%   BMI 20.20 kg/m   Wt Readings from Last 3 Encounters:  07/12/20 144 lb 12.8 oz (65.7 kg)  06/21/20 143 lb (64.9 kg)  04/03/19 146 lb (66.2 kg)    Physical Exam Vitals and nursing note reviewed.  Constitutional:      Appearance: Normal appearance. He is normal weight.  HENT:     Head: Normocephalic and atraumatic.     Right Ear: Tympanic membrane, ear canal and external ear normal.     Left Ear: Tympanic membrane, ear canal and external ear normal.     Nose: Nose normal. No congestion or rhinorrhea.     Mouth/Throat:     Mouth: Mucous membranes are moist.     Pharynx: Oropharynx is clear. Posterior oropharyngeal erythema present.  Eyes:     General: No scleral icterus.    Extraocular Movements: Extraocular movements intact.     Conjunctiva/sclera: Conjunctivae normal.     Pupils: Pupils are equal, round, and reactive to light.  Neck:     Vascular: No carotid bruit.  Cardiovascular:     Rate and Rhythm: Normal rate and regular rhythm.     Pulses: Normal pulses.     Heart sounds: Normal heart sounds. No murmur heard.   Pulmonary:     Effort: Pulmonary effort is normal.     Breath sounds: Normal breath  sounds. No wheezing.  Abdominal:     General: Abdomen is flat. Bowel sounds are normal. There is no distension.     Palpations: Abdomen is soft.     Tenderness: There is no abdominal tenderness. There is no right CVA tenderness, left CVA tenderness, guarding or rebound.  Musculoskeletal:        General: Normal range of motion.     Cervical back: Normal range of motion. No rigidity or tenderness.     Right lower leg: No edema.     Left lower leg: No edema.  Lymphadenopathy:     Cervical: No cervical adenopathy.  Skin:    General: Skin is warm and dry.     Capillary Refill: Capillary refill takes less than 2 seconds.  Neurological:     General: No focal deficit present.     Mental Status: He is alert and oriented to person, place, and time.     Cranial Nerves: No cranial nerve deficit.     Sensory: No sensory deficit.     Motor: Weakness present.     Coordination: Coordination normal. Finger-Nose-Finger Test and Heel to Shin Test normal.     Gait: Gait normal.     Comments: Bilateral mild weakness with pulse-like quality when counter pressure applied to lower extremities and  Psychiatric:        Mood and Affect: Mood normal.        Behavior: Behavior normal.        Thought Content: Thought content normal.        Judgment: Judgment normal.     Results for orders placed or performed during the hospital encounter of 06/21/20  Thyroid Panel With TSH  Result Value Ref Range   TSH 1.530 0.450 - 4.500 uIU/mL   T4, Total 8.6 4.5 - 12.0 ug/dL   T3 Uptake Ratio 30 24 - 39 %   Free  Thyroxine Index 2.6 1.2 - 4.9  Lipid panel  Result Value Ref Range   Cholesterol 150 0 - 200 mg/dL   Triglycerides 89 <161 mg/dL   HDL 49 >09 mg/dL   Total CHOL/HDL Ratio 3.1 RATIO   VLDL 18 0 - 40 mg/dL   LDL Cholesterol 83 0 - 99 mg/dL  Comprehensive metabolic panel  Result Value Ref Range   Sodium 139 135 - 145 mmol/L   Potassium 3.8 3.5 - 5.1 mmol/L   Chloride 102 98 - 111 mmol/L   CO2 28 22 - 32  mmol/L   Glucose, Bld 88 70 - 99 mg/dL   BUN 8 6 - 20 mg/dL   Creatinine, Ser 6.04 0.61 - 1.24 mg/dL   Calcium 9.8 8.9 - 54.0 mg/dL   Total Protein 7.7 6.5 - 8.1 g/dL   Albumin 4.9 3.5 - 5.0 g/dL   AST 19 15 - 41 U/L   ALT 13 0 - 44 U/L   Alkaline Phosphatase 53 38 - 126 U/L   Total Bilirubin 1.2 0.3 - 1.2 mg/dL   GFR, Estimated >98 >11 mL/min   Anion gap 9 5 - 15  CBC w/Diff/Platelet  Result Value Ref Range   WBC 7.2 4.0 - 10.5 K/uL   RBC 5.45 4.22 - 5.81 MIL/uL   Hemoglobin 16.7 13.0 - 17.0 g/dL   HCT 91.4 78.2 - 95.6 %   MCV 86.6 80.0 - 100.0 fL   MCH 30.6 26.0 - 34.0 pg   MCHC 35.4 30.0 - 36.0 g/dL   RDW 21.3 08.6 - 57.8 %   Platelets 275 150 - 400 K/uL   nRBC 0.0 0.0 - 0.2 %   Neutrophils Relative % 52 %   Neutro Abs 3.8 1.7 - 7.7 K/uL   Lymphocytes Relative 39 %   Lymphs Abs 2.8 0.7 - 4.0 K/uL   Monocytes Relative 7 %   Monocytes Absolute 0.5 0.1 - 1.0 K/uL   Eosinophils Relative 1 %   Eosinophils Absolute 0.1 0.0 - 0.5 K/uL   Basophils Relative 1 %   Basophils Absolute 0.1 0.0 - 0.1 K/uL   Immature Granulocytes 0 %   Abs Immature Granulocytes 0.01 0.00 - 0.07 K/uL      Assessment & Plan:   Problem List Items Addressed This Visit      Other   Family history of Charcot-Marie-Tooth disease    Assessment: Continued symptoms of numbness and tingling in hands and feet bilaterally Mild weakness noted with counter pressure to lower extremities bilaterally with no other noted neurological deficits.  Given family hx concerns for CMT syndrome present. Request for referral to Dr. Suzan Slick with [facility] (grandfather's provider), unfortunately, it appears he has passed away. He is concerned about the length of time until he can be seen with current referral. Discussion/Recommendations: Contact neurology office to determine if wait list is available for cancellations or the option to see an alternate provider exists. Will determine if another office can get him  in any sooner Monitor for worsen or new symptoms Journal of symptoms, when they occur, and any associated factors may be helpful for neurology (worse with smoking, exercise, marijuana use, certain foods, time of day, etc) Plan: Will have referral coordinator see if another neurologist is available sooner F/U if symptoms worsen or fail to improve.       Bilateral numbness and tingling of arms and legs    Assessment: No sensation deficit, coordination, movement, reflex, or muscle tone noted in  UE or LE. Strength appropriate in UE Decreased strength bilaterally when counter pressure applied to calves anteriorly and posteriorly with pulsating quality. No decreased strength in hips or spine. No indication of thyroid, calcium, or B12 deficiency on labs.  Family hx of CMT- concern this may be causative factor for him Discussion/Recommendations: Monitor for symptoms and record when they appear and any associated factors (time of day, activity, drug/alcohol/nicotine use, fatigue, etc) Contact neurology to see if waitlist or cancellation list available Plan: Will request referral coordinator to determine if alternative neurology office can see him sooner July 8 appt scheduled with [facility] for now       Bipolar 1 disorder [facility])    Assessment: Current treatment with [facility] Sx appear well controlled with no concerns for mania or depression present Discussion/Recommendations: Continue treatment with psychiatry as directed Notify of any changes to symptoms or new symptoms Plan: F/U as directed        Encounter for annual physical exam - Primary    Assessment: Labs reviewed- no concerning findings present.  Mild weakness noted to LE bilaterally with no other concerning findings or neurological deficits detected. Discussion/Recommendations: Labs discussed in detail Diet and exercise recommendations provided Plan: F/U in 1 year for CPE and labs F/U with neurology as  recommended.       Attention deficit hyperactivity disorder (ADHD), combined type    Assessment: Previous dx of ADHD with medication management in youth. ADHD questionnaire provided today with evidence of decreased attention and focus present based on self reported symptoms Fidgting is present during exam- reports of difficulty in school and work present There is concern with underlying Bipolar d/o and adding stimulant medication which may exacerbate sx With difficulties present, avoiding treatment may be detrimental to patient, as well Discussion/Recommendations: Recommend discussion with psychiatry about treatment for ADHD symptoms Concerns with adding stimulant medication in the setting of bipolar disorder and exacerbation of symptoms  Plan: Will evaluate the safety of medication options for treatment of ADHD with Lamictal Consider non-stimulant medications as first line options Appreciate input from psychiatry on medication options and counseling services for additional treatment. Will F/U with patient next week.       History of concussion    Assessment: Remote history of reported concussions w/o LOC or concerning sx Does have presence of numbness and tingling- in process of work up for CMT with close family hx ADHD sx present could also have overlap with brain injury No findings present that would indicate a need for urgent imaging Discussion/Recommendations: Concussions can have prolonged impact on thinking and functioning- it is hard to determine if this could be cause of sx present or not Imaging would likely not be beneficial at this time, neurology would be best to evaluate need for specialized imaging to determine if possible TBI from remote concussion is present.  Plan: Pt to discuss with neurology to determine need for possible neurocognitive testing or imaging            Discussed aspirin prophylaxis for myocardial infarction prevention and decision was it was  not indicated  LABORATORY TESTING:  Health maintenance labs ordered today as discussed above.  - STI testing: deferred   IMMUNIZATIONS:   - Tdap: Tetanus vaccination status reviewed: last tetanus booster within 10 years. - Influenza: Up to date - Pneumovax: Not applicable - Prevnar: Not applicable - HPV: Not applicable - Zostavax vaccine: Not applicable  SCREENING: - Colonoscopy: Not applicable  Discussed with patient purpose of the colonoscopy  is to detect colon cancer at curable precancerous or Marika Mahaffy stages   - AAA Screening: Not applicable  -Hearing Test: Not applicable  -Spirometry: Not applicable   PATIENT COUNSELING:   For all adult patients, I recommend A well balanced diet low in saturated fats, cholesterol, and moderation in carbohydrates.   This can be as simple as monitoring portion sizes and cutting back on sugary beverages such as soda and  juice to start with.    Daily water consumption of at least 64 ounces.  Physical activity at least 180 minutes per week, if just starting out.   This can be as simple as taking the stairs instead of the elevator and walking 2-3 laps around the office  purposefully every day.   STD protection, partner selection, and regular testing if high risk.  Limited consumption of alcoholic beverages if alcohol is consumed.  For women, I recommend no more than 7 alcoholic beverages per week, spread out throughout the week.  Avoid "binge" drinking or consuming large quantities of alcohol in one setting.   Please let me know if you feel you may need help with reduction or quitting alcohol consumption.   Avoidance of nicotine, if used.  Please let me know if you feel you may need help with reduction or quitting nicotine use.   Daily mental health attention.  This can be in the form of 5 minute daily meditation, prayer, journaling, yoga, reflection, etc.   Purposeful attention to your emotions and mental state can significantly improve  your overall wellbeing  and  Health.  Please know that I am here to help you with all of your health care goals and am happy to work with you to find a solution that works best for you.  The greatest advice I have received with any changes in life are to take it one step at a time, that even means if all you can focus on is the next 60 seconds, then do that and celebrate your victories.  With any changes in life, you will have set backs, and that is OK. The important thing to remember is, if you have a set back, it is not a failure, it is an opportunity to try again!  Health Maintenance Recommendations Screening Testing  Mammogram  Every 1 -2 years based on history and risk factors  Starting at age 25  Pap Smear  Ages 21-39 every 3 years  Ages 7230-65 every 5 years with HPV testing  More frequent testing may be required based on results and history  Colon Cancer Screening  Every 1-10 years based on test performed, risk factors, and history  Starting at age 25  Bone Density Screening  Every 2-10 years based on history  Starting at age 25 for women  Recommendations for men differ based on medication usage, history, and risk factors  AAA Screening  One time ultrasound  Men 7665-25 years old who have every smoked  Lung Cancer Screening  Low Dose Lung CT every 12 months  Age 96-80 years with a 30 pack-year smoking history who still smoke or who have quit within the last 15 years  Screening Labs  Routine  Labs: Complete Blood Count (CBC), Complete Metabolic Panel (CMP), Cholesterol (Lipid Panel)  Every 6-12 months based on history and medications  May be recommended more frequently based on current conditions or previous results  Hemoglobin A1c Lab  Every 3-12 months based on history and previous results  Starting at age 25 or earlier  with diagnosis of diabetes, high cholesterol, BMI >26, and/or risk factors  Frequent monitoring for patients with diabetes to  ensure blood sugar control  Thyroid Panel (TSH w/ T3 & T4)  Every 6 months based on history, symptoms, and risk factors  May be repeated more often if on medication  HIV  One time testing for all patients 4 and older  May be repeated more frequently for patients with increased risk factors or exposure  Hepatitis C  One time testing for all patients 56 and older  May be repeated more frequently for patients with increased risk factors or exposure  Gonorrhea, Chlamydia  Every 12 months for all sexually active persons 13-24 years  Additional monitoring may be recommended for those who are considered high risk or who have symptoms  PSA  Men 32-58 years old with risk factors  Additional screening may be recommended from age 64-69 based on risk factors, symptoms, and history  Vaccine Recommendations  Tetanus Booster  All adults every 10 years  Flu Vaccine  All patients 6 months and older every year  COVID Vaccine  All patients 12 years and older  Initial dosing with booster  May recommend additional booster based on age and health history  HPV Vaccine  2 doses all patients age 3-26  Dosing may be considered for patients over 26  Shingles Vaccine (Shingrix)  2 doses all adults 55 years and older  Pneumonia (Pneumovax 23)  All adults 65 years and older  May recommend earlier dosing based on health history  Pneumonia (Prevnar 45)  All adults 65 years and older  Dosed 1 year after Pneumovax 23  Additional Screening, Testing, and Vaccinations may be recommended on an individualized basis based on family history, health history, risk factors, and/or exposure.    Follow up plan: NEXT PREVENTATIVE PHYSICAL DUE IN 1 YEAR. Return in about 1 year (around 07/12/2021) for physical.

## 2020-07-13 ENCOUNTER — Encounter (HOSPITAL_BASED_OUTPATIENT_CLINIC_OR_DEPARTMENT_OTHER): Payer: Self-pay | Admitting: Nurse Practitioner

## 2020-07-13 DIAGNOSIS — F902 Attention-deficit hyperactivity disorder, combined type: Secondary | ICD-10-CM | POA: Insufficient documentation

## 2020-07-13 DIAGNOSIS — Z0000 Encounter for general adult medical examination without abnormal findings: Secondary | ICD-10-CM | POA: Insufficient documentation

## 2020-07-13 DIAGNOSIS — Z87820 Personal history of traumatic brain injury: Secondary | ICD-10-CM | POA: Insufficient documentation

## 2020-07-13 NOTE — Assessment & Plan Note (Signed)
Assessment: Labs reviewed- no concerning findings present.  Mild weakness noted to LE bilaterally with no other concerning findings or neurological deficits detected. Discussion/Recommendations: Labs discussed in detail Diet and exercise recommendations provided Plan: F/U in 1 year for CPE and labs F/U with neurology as recommended.

## 2020-07-13 NOTE — Assessment & Plan Note (Signed)
Assessment: No sensation deficit, coordination, movement, reflex, or muscle tone noted in UE or LE. Strength appropriate in UE Decreased strength bilaterally when counter pressure applied to calves anteriorly and posteriorly with pulsating quality. No decreased strength in hips or spine. No indication of thyroid, calcium, or B12 deficiency on labs.  Family hx of CMT- concern this may be causative factor for him Discussion/Recommendations: Monitor for symptoms and record when they appear and any associated factors (time of day, activity, drug/alcohol/nicotine use, fatigue, etc) Contact neurology to see if waitlist or cancellation list available Plan: Will request referral coordinator to determine if alternative neurology office can see him sooner July 8 appt scheduled with [facility] for now

## 2020-07-13 NOTE — Assessment & Plan Note (Signed)
Assessment: Continued symptoms of numbness and tingling in hands and feet bilaterally Mild weakness noted with counter pressure to lower extremities bilaterally with no other noted neurological deficits.  Given family hx concerns for CMT syndrome present. Request for referral to Dr. Suzan Slick with [facility] (grandfather's provider), unfortunately, it appears he has passed away. He is concerned about the length of time until he can be seen with current referral. Discussion/Recommendations: Contact neurology office to determine if wait list is available for cancellations or the option to see an alternate provider exists. Will determine if another office can get him in any sooner Monitor for worsen or new symptoms Journal of symptoms, when they occur, and any associated factors may be helpful for neurology (worse with smoking, exercise, marijuana use, certain foods, time of day, etc) Plan: Will have referral coordinator see if another neurologist is available sooner F/U if symptoms worsen or fail to improve.

## 2020-07-13 NOTE — Assessment & Plan Note (Signed)
Assessment: Previous dx of ADHD with medication management in youth. ADHD questionnaire provided today with evidence of decreased attention and focus present based on self reported symptoms Fidgting is present during exam- reports of difficulty in school and work present There is concern with underlying Bipolar d/o and adding stimulant medication which may exacerbate sx With difficulties present, avoiding treatment may be detrimental to patient, as well Discussion/Recommendations: Recommend discussion with psychiatry about treatment for ADHD symptoms Concerns with adding stimulant medication in the setting of bipolar disorder and exacerbation of symptoms  Plan: Will evaluate the safety of medication options for treatment of ADHD with Lamictal Consider non-stimulant medications as first line options Appreciate input from psychiatry on medication options and counseling services for additional treatment. Will F/U with patient next week.

## 2020-07-13 NOTE — Assessment & Plan Note (Signed)
Assessment: Current treatment with [facility] Sx appear well controlled with no concerns for mania or depression present Discussion/Recommendations: Continue treatment with psychiatry as directed Notify of any changes to symptoms or new symptoms Plan: F/U as directed

## 2020-07-13 NOTE — Assessment & Plan Note (Signed)
Assessment: Remote history of reported concussions w/o LOC or concerning sx Does have presence of numbness and tingling- in process of work up for CMT with close family hx ADHD sx present could also have overlap with brain injury No findings present that would indicate a need for urgent imaging Discussion/Recommendations: Concussions can have prolonged impact on thinking and functioning- it is hard to determine if this could be cause of sx present or not Imaging would likely not be beneficial at this time, neurology would be best to evaluate need for specialized imaging to determine if possible TBI from remote concussion is present.  Plan: Pt to discuss with neurology to determine need for possible neurocognitive testing or imaging

## 2020-07-16 ENCOUNTER — Encounter: Payer: Self-pay | Admitting: Cardiology

## 2020-07-16 ENCOUNTER — Other Ambulatory Visit: Payer: Self-pay

## 2020-07-16 ENCOUNTER — Ambulatory Visit: Payer: 59 | Admitting: Cardiology

## 2020-07-16 ENCOUNTER — Ambulatory Visit (INDEPENDENT_AMBULATORY_CARE_PROVIDER_SITE_OTHER): Payer: 59

## 2020-07-16 VITALS — BP 112/62 | HR 87 | Ht 71.0 in | Wt 145.0 lb

## 2020-07-16 DIAGNOSIS — Z820 Family history of epilepsy and other diseases of the nervous system: Secondary | ICD-10-CM

## 2020-07-16 DIAGNOSIS — R079 Chest pain, unspecified: Secondary | ICD-10-CM

## 2020-07-16 DIAGNOSIS — R42 Dizziness and giddiness: Secondary | ICD-10-CM

## 2020-07-16 NOTE — Patient Instructions (Signed)
Medication Instructions:  Your physician recommends that you continue on your current medications as directed. Please refer to the Current Medication list given to you today.  *If you need a refill on your cardiac medications before your next appointment, please call your pharmacy*   Lab Work: None If you have labs (blood work) drawn today and your tests are completely normal, you will receive your results only by: Marland Kitchen MyChart Message (if you have MyChart) OR . A paper copy in the mail If you have any lab test that is abnormal or we need to change your treatment, we will call you to review the results.   Testing/Procedures: Your physician has requested that you have an echocardiogram. Echocardiography is a painless test that uses sound waves to create images of your heart. It provides your doctor with information about the size and shape of your heart and how well your heart's chambers and valves are working. This procedure takes approximately one hour. There are no restrictions for this procedure.  Zio Monitor 7 days    Follow-Up: At [facility], you and your health needs are our priority.  As part of our continuing mission to provide you with exceptional heart care, we have created designated Provider Care Teams.  These Care Teams include your primary Cardiologist (physician) and Advanced Practice Providers (APPs -  Physician Assistants and Nurse Practitioners) who all work together to provide you with the care you need, when you need it.  We recommend signing up for the patient portal called "MyChart".  Sign up information is provided on this After Visit Summary.  MyChart is used to connect with patients for Virtual Visits (Telemedicine).  Patients are able to view lab/test results, encounter notes, upcoming appointments, etc.  Non-urgent messages can be sent to your provider as well.   To learn more about what you can do with MyChart, go to [email or web address].    Your next  appointment:   3 month(s)  The format for your next appointment:   In Person  Provider:   Gypsy Balsam, MD   Other Instructions

## 2020-07-16 NOTE — Progress Notes (Signed)
Cardiology Consultation:    Date:  07/16/2020   ID:  Stephen Downs, DOB 08/30/1995, MRN [id number]  PCP:  Stephen Eth, NP  Cardiologist:  Stephen Balsam, MD   Referring MD: Stephen Eth, NP   Chief Complaint  Patient presents with  . Dizziness    Chest pain- Venetia Maxon Injury that occurred during [place] 2016    History of Present Illness:    Stephen Downs is a 25 y.o. male who is being seen today for the evaluation of chest injury many years ago dizzy at the request of Stephen Downs, Stephen Amabile, NP.  Past medical history significant for family history of Charcot-Marie-Tooth disease, he does not have a proven disease he had years ago he was playing on the field and was hit by a ball in the chest.  He could be cruise.  He did have protection device on his chest but in spite of that he ended up having big bruise on the chest after that he end up going to [facility] but was discharged home with no problems since that time he said he described to have some dizziness/lightheadedness what you mean by that is the fact that he walks and sometimes he will go left to right.  Dizziness happen at any situation he could be sitting he could be laying down he could be standing dizziness can last all day.  He never passed out because of this but he felt like many times he was close to it.  He still able to walk and exercise on the regular basis with no major issues.  He still play some games but not as much as he used to. He does not have any family history of premature cardiac death He vapes, He drink some alcohol on social occasions. He does not exercise on the regular basis  Past Medical History:  Diagnosis Date  . ADHD   . Bilateral numbness and tingling of arms and legs 06/21/2020  . Bipolar 1 disorder (HCC)   . Encounter to establish care 06/21/2020  . Family history of Charcot-Marie-Tooth disease 06/21/2020  . History of syncope 06/21/2020  . Laboratory tests ordered as part of a complete  physical exam (CPE) 06/21/2020  . Positional lightheadedness 06/21/2020    Past Surgical History:  Procedure Laterality Date  . NO PAST SURGERIES      Current Medications: Current Meds  Medication Sig  . lamoTRIgine (LAMICTAL) 150 MG tablet Take 150 mg by mouth daily.     Allergies:   Patient has no known allergies.   Social History   Socioeconomic History  . Marital status: Single    Spouse name: Not on file  . Number of children: Not on file  . Years of education: Not on file  . Highest education level: Not on file  Occupational History  . Not on file  Tobacco Use  . Smoking status: Former Smoker    Packs/day: 0.75    Types: Cigarettes  . Smokeless tobacco: Never Used  Vaping Use  . Vaping Use: Every day  . Substances: Nicotine, CBD  Substance and Sexual Activity  . Alcohol use: Yes    Comment: occasionally  . Drug use: Yes    Frequency: 14.0 times per week    Types: Marijuana    Comment: twice a day use  . Sexual activity: Yes    Birth control/protection: Pill  Other Topics Concern  . Not on file  Social History Narrative  . Not on  file   Social Determinants of Health   Financial Resource Strain: Not on file  Food Insecurity: Not on file  Transportation Needs: Not on file  Physical Activity: Insufficiently Active  . Days of Exercise per Week: 3 days  . Minutes of Exercise per Session: 30 min  Stress: Not on file  Social Connections: Not on file     Family History: The patient's family history includes Cerebral aneurysm in his maternal grandfather; Charcot-Marie-Tooth disease in his maternal grandfather and paternal uncle; Prostate cancer in his maternal great-grandfather; Stroke in his father; Thyroid cancer in his maternal grandmother. ROS:   Please see the history of present illness.    All 14 point review of systems negative except as described per history of present illness.  EKGs/Labs/Other Studies Reviewed:    The following studies were  reviewed today:   EKG:  EKG is  ordered today.  The ekg ordered today demonstrates normal sinus rhythm, right axis deviation normal P interval nonspecific ST segment changes  Recent Labs: 06/21/2020: ALT 13; BUN 8; Creatinine, Ser 0.95; Hemoglobin 16.7; Platelets 275; Potassium 3.8; Sodium 139; TSH 1.530  Recent Lipid Panel    Component Value Date/Time   CHOL 150 06/21/2020 1601   TRIG 89 06/21/2020 1601   HDL 49 06/21/2020 1601   CHOLHDL 3.1 06/21/2020 1601   VLDL 18 06/21/2020 1601   LDLCALC 83 06/21/2020 1601    Physical Exam:    VS:  BP 112/62 (BP Location: Right Arm, Patient Position: Sitting)   Pulse 87   Ht 5\' 11"  (1.803 m)   Wt 145 lb (65.8 kg)   SpO2 95%   BMI 20.22 kg/m     Wt Readings from Last 3 Encounters:  07/16/20 145 lb (65.8 kg)  07/12/20 144 lb 12.8 oz (65.7 kg)  06/21/20 143 lb (64.9 kg)     GEN:  Well nourished, well developed in no acute distress HEENT: Normal NECK: No JVD; No carotid bruits LYMPHATICS: No lymphadenopathy CARDIAC: RRR, no murmurs, no rubs, no gallops RESPIRATORY:  Clear to auscultation without rales, wheezing or rhonchi  ABDOMEN: Soft, non-tender, non-distended MUSCULOSKELETAL:  No edema; No deformity  SKIN: Warm and dry NEUROLOGIC:  Alert and oriented x 3 PSYCHIATRIC:  Normal affect   ASSESSMENT:    1. Positional lightheadedness   2. Chest pain of uncertain etiology   3. Family history of Charcot-Marie-Tooth disease    PLAN:    In order of problems listed above:  1. Lightheadedness and dizziness in this young gentleman with history of chest trauma in 2016 while playing lacrosse.  I will ask him to wear monitor to make sure he does not have any arrhythmia.  However his symptomatology does not look too suspicious.  I will also ask him to have an echocardiogram to make sure his truly heart is normal.  I did review his cholesterol which looks good with LDL of 83 and HDL 49.  We will continue monitoring. 2. Chest pain he does  have any chest pain right now.  He describes some strange feeling of warmth in the chest that is not related to exercise doubt very much that this is related to coronary disease. 3. Family history of Charcot-Marie-Tooth disease.  He scheduled to see neurology which I think is appropriate step.   Medication Adjustments/Labs and Tests Ordered: Current medicines are reviewed at length with the patient today.  Concerns regarding medicines are outlined above.  Orders Placed This Encounter  Procedures  . EKG 12-Lead  .  ECHOCARDIOGRAM COMPLETE   No orders of the defined types were placed in this encounter.   Signed, Georgeanna Lea, MD, [facility]. 07/16/2020 4:45 PM    [facility]

## 2020-07-26 DIAGNOSIS — R42 Dizziness and giddiness: Secondary | ICD-10-CM | POA: Diagnosis not present

## 2020-08-12 ENCOUNTER — Ambulatory Visit (HOSPITAL_COMMUNITY): Payer: 59 | Attending: Cardiovascular Disease

## 2020-08-12 ENCOUNTER — Other Ambulatory Visit: Payer: Self-pay

## 2020-08-12 DIAGNOSIS — R42 Dizziness and giddiness: Secondary | ICD-10-CM | POA: Diagnosis not present

## 2020-08-12 DIAGNOSIS — R079 Chest pain, unspecified: Secondary | ICD-10-CM

## 2020-08-12 LAB — ECHOCARDIOGRAM COMPLETE
Area-P 1/2: 3.68 cm2
S' Lateral: 2.9 cm

## 2020-09-06 ENCOUNTER — Encounter: Payer: Self-pay | Admitting: Neurology

## 2020-09-06 ENCOUNTER — Ambulatory Visit: Payer: 59 | Admitting: Neurology

## 2020-09-06 ENCOUNTER — Other Ambulatory Visit: Payer: Self-pay | Admitting: Neurology

## 2020-09-06 VITALS — BP 132/70 | HR 79 | Ht 71.0 in | Wt 144.0 lb

## 2020-09-06 DIAGNOSIS — R202 Paresthesia of skin: Secondary | ICD-10-CM

## 2020-09-06 DIAGNOSIS — M25541 Pain in joints of right hand: Secondary | ICD-10-CM | POA: Diagnosis not present

## 2020-09-06 DIAGNOSIS — M25542 Pain in joints of left hand: Secondary | ICD-10-CM

## 2020-09-06 MED ORDER — GABAPENTIN 100 MG PO CAPS: 100.0000 mg | ORAL_CAPSULE | Freq: Three times a day (TID) | ORAL | 3 refills | Status: DC

## 2020-09-06 NOTE — Progress Notes (Signed)
Reason for visit: Numbness in the feet  Referring physician: Dr. Rodell Perna Stephen Downs is a 25 y.o. male  History of present illness:  Stephen Downs is a 25 year old right-handed white male with a history of bipolar disorder who reports numbness and tingling sensations that had developed on the feet bilaterally over the last year.  The patient reports some discomfort with the tingling, at times it may prevent him from sleeping well.  He has also noted some uncomfortable sensations in the hands with possibly some tingling on the dorsum of the fingers, but mainly he complains of pain in the joints of the hands with gripping objects.  He does report some mild gait instability but he denies any falls.  He has not had any true weakness of the arms or legs.  He denies issues controlling the bowels or the bladder.  He does have some chronic pain issues involving the neck and low back, he sees a chiropractor on a regular basis for this.  He reports no numbness on the face or on the body.  He does have a family history of type Ia Charcot-Marie-Tooth disease that involves his paternal grandfather and 2 paternal uncles but his father is apparently unaffected.  His father does have rheumatoid arthritis that was recently diagnosed.  The patient is sent here for further evaluation.  Past Medical History:  Diagnosis Date   ADHD    Bilateral numbness and tingling of arms and legs 06/21/2020   Bipolar 1 disorder (HCC)    Encounter to establish care 06/21/2020   Family history of Charcot-Marie-Tooth disease 06/21/2020   History of syncope 06/21/2020   Laboratory tests ordered as part of a complete physical exam (CPE) 06/21/2020   Positional lightheadedness 06/21/2020    Past Surgical History:  Procedure Laterality Date   NO PAST SURGERIES      Family History  Problem Relation Age of Onset   Stroke Father    Charcot-Marie-Tooth disease Paternal Uncle    Thyroid cancer Maternal Grandmother     Charcot-Marie-Tooth disease Maternal Grandfather    Cerebral aneurysm Maternal Grandfather    Prostate cancer Maternal Great-grandfather     Social history:  reports that he has quit smoking. His smoking use included cigarettes. He smoked an average of 0.75 packs per day. He has never used smokeless tobacco. He reports current alcohol use. He reports current drug use. Frequency: 14.00 times per week. Drug: Marijuana.  Medications:  Prior to Admission medications   Medication Sig Start Date End Date Taking? Authorizing Provider  lamoTRIgine (LAMICTAL) 150 MG tablet Take 150 mg by mouth daily.   Yes [provider]     No Known Allergies  ROS:  Out of a complete 14 system review of symptoms, the patient complains only of the following symptoms, and all other reviewed systems are negative.  Numbness in the feet Mild balance problems  Blood pressure 132/70, pulse 79, height 5\' 11"  (1.803 m), weight 144 lb (65.3 kg).  Physical Exam  General: The patient is alert and cooperative at the time of the examination.  Eyes: Pupils are equal, round, and reactive to light. Discs are flat bilaterally.  Neck: The neck is supple, no carotid bruits are noted.  Respiratory: The respiratory examination is clear.  Cardiovascular: The cardiovascular examination reveals a regular rate and rhythm, no obvious murmurs or rubs are noted.  Skin: Extremities are without significant edema.  Neurologic Exam  Mental status: The patient is alert and oriented  x 3 at the time of the examination. The patient has apparent normal recent and remote memory, with an apparently normal attention span and concentration ability.  Cranial nerves: Facial symmetry is present. There is good sensation of the face to pinprick and soft touch bilaterally. The strength of the facial muscles and the muscles to head turning and shoulder shrug are normal bilaterally. Speech is well enunciated, no aphasia or dysarthria is  noted. Extraocular movements are full. Visual fields are full. The tongue is midline, and the patient has symmetric elevation of the soft palate. No obvious hearing deficits are noted.  Motor: The motor testing reveals 5 over 5 strength of all 4 extremities. Good symmetric motor tone is noted throughout.  Sensory: Sensory testing is intact to pinprick, soft touch, vibration sensation, and position sense on all 4 extremities. No evidence of extinction is noted.  Coordination: Cerebellar testing reveals good finger-nose-finger and heel-to-shin bilaterally.  Gait and station: Gait is normal. Tandem gait is normal. Romberg is negative. No drift is seen.  The patient is able to walk on the heels and the toes bilaterally.  Reflexes: Deep tendon reflexes are symmetric and normal bilaterally.  If anything, reflexes are slightly brisk, the patient has 2-3 beats of ankle clonus bilaterally.  Toes are downgoing bilaterally.   Assessment/Plan:  1.  Paresthesias, bilateral feet  2.  Family history of Charcot-Marie-Tooth disease, type Ia  The clinical examination today is completely normal.  There is no clear stocking pattern pinprick sensory deficit, the patient has normal strength and gait, and deep tendon reflexes are if anything slightly brisk.  The patient likely does not have type Ia Charcot-Marie-Tooth disease.  At any rate, he is reporting some tingling in the feet and further investigation is required.  The patient will be set up for blood work today, he will have nerve conduction studies on 1 arm and 1 leg, EMG study on 1 leg.  If the studies are unremarkable, we may go on to pursue MRI of the brain and cervical spine to exclude demyelinating disease.  He will follow-up for the EMG evaluation.  Marlan Palau MD 09/06/2020 9:55 AM  [facility] [address] [place], [place] [address]  Phone [phone] Fax [phone]

## 2020-09-09 LAB — LYME DISEASE SEROLOGY W/REFLEX: Lyme Total Antibody EIA: NEGATIVE

## 2020-09-10 LAB — ANGIOTENSIN CONVERTING ENZYME: Angio Convert Enzyme: 67 U/L (ref 14–82)

## 2020-09-10 LAB — VITAMIN B12: Vitamin B-12: 511 pg/mL (ref 232–1245)

## 2020-09-10 LAB — RPR: RPR Ser Ql: NONREACTIVE

## 2020-09-10 LAB — SEDIMENTATION RATE: Sed Rate: 2 mm/hr (ref 0–15)

## 2020-09-10 LAB — RHEUMATOID FACTOR: Rheumatoid fact SerPl-aCnc: <10 IU/mL (ref <14.0)

## 2020-09-10 LAB — HIV ANTIBODY (ROUTINE TESTING W REFLEX): HIV Screen 4th Generation wRfx: NONREACTIVE

## 2020-09-10 LAB — CYCLIC CITRUL PEPTIDE ANTIBODY, IGG/IGA: Cyclic Citrullin Peptide Ab: <1 units (ref 0–19)

## 2020-09-10 LAB — ANA W/REFLEX: Anti Nuclear Antibody (ANA): NEGATIVE

## 2020-09-10 LAB — COPPER, SERUM: Copper: 76 ug/dL (ref 63–121)

## 2020-09-26 ENCOUNTER — Ambulatory Visit (INDEPENDENT_AMBULATORY_CARE_PROVIDER_SITE_OTHER): Payer: 59 | Admitting: Diagnostic Neuroimaging

## 2020-09-26 ENCOUNTER — Encounter (INDEPENDENT_AMBULATORY_CARE_PROVIDER_SITE_OTHER): Payer: 59 | Admitting: Diagnostic Neuroimaging

## 2020-09-26 DIAGNOSIS — R202 Paresthesia of skin: Secondary | ICD-10-CM | POA: Diagnosis not present

## 2020-09-26 DIAGNOSIS — M25541 Pain in joints of right hand: Secondary | ICD-10-CM

## 2020-09-26 DIAGNOSIS — Z0289 Encounter for other administrative examinations: Secondary | ICD-10-CM

## 2020-09-26 NOTE — Procedures (Signed)
[facility]  NCS (NERVE CONDUCTION STUDY) WITH EMG (ELECTROMYOGRAPHY) REPORT   STUDY DATE: 09/18/2020 PATIENT NAME: Stephen Downs DOB: 06-Feb-1996 MRN: [id number]  ORDERING CLINICIAN: York Spaniel, MD   TECHNOLOGIST: Durenda Age ELECTROMYOGRAPHER: Glenford Bayley. Yuchen Fedor, MD  CLINICAL INFORMATION: 25 year old male with upper lower extremity numbness and tingling.  Family history of Charcot-Marie-Tooth neuropathy.  FINDINGS: NERVE CONDUCTION STUDY:  Right median, right ulnar, right peroneal and tibial motor responses are normal.  Right median, right ulnar, right sural and right superficial peroneal sensory responses are normal.  Right median to ulnar transcarpal comparison study is normal.  Right tibial and right ulnar F-wave latencies are normal.   NEEDLE ELECTROMYOGRAPHY:  Needle examination of right upper extremity is normal.   IMPRESSION:   Normal study.  No electrodiagnostic evidence of large fiber neuropathy at this time.   INTERPRETING PHYSICIAN:  Suanne Marker, MD Certified in Neurology, Neurophysiology and Neuroimaging  [facility] [address], [address] [place], [place] [address] [phone]  [facility]    Nerve / Sites Muscle Latency Ref. Amplitude Ref. Rel Amp Segments Distance Velocity Ref. Area    ms ms mV mV %  cm m/s m/s mVms  R Median - APB     Wrist APB 2.9 ?4.4 8.9 ?4.0 100 Wrist - APB 7   35.8     Upper arm APB 7.0  9.2  103 Upper arm - Wrist 23 57 ?49 35.7  R Ulnar - ADM     Wrist ADM 2.3 ?3.3 9.5 ?6.0 100 Wrist - ADM 7   33.1     B.Elbow ADM 5.6  8.3  87.1 B.Elbow - Wrist 21 63 ?49 29.8     A.Elbow ADM 7.3  8.6  104 A.Elbow - B.Elbow 10 61 ?49 31.4  R Peroneal - EDB     Ankle EDB 3.7 ?6.5 5.0 ?2.0 100 Ankle - EDB 9   17.4     Fib head EDB 10.5  4.7  94.3 Fib head - Ankle 30 44 ?44 16.8     Pop fossa EDB 12.7  4.5  96.6 Pop fossa - Fib head 10 44 ?44 16.6         Pop fossa - Ankle      R  Tibial - AH     Ankle AH 3.3 ?5.8 8.4 ?4.0 100 Ankle - AH 9   16.4     Pop fossa AH 13.0  6.8  80.8 Pop fossa - Ankle 40 41 ?41 15.1             SNC    Nerve / Sites Rec. Site Peak Lat Ref.  Amp Ref. Segments Distance Peak Diff Ref.    ms ms V V  cm ms ms  R Sural - Ankle (Calf)     Calf Ankle 3.2 ?4.4 14 ?6 Calf - Ankle 14    R Superficial peroneal - Ankle     Lat leg Ankle 3.5 ?4.4 11 ?6 Lat leg - Ankle 14    R Median, Ulnar - Transcarpal comparison     Median Palm Wrist 1.9 ?2.2 99 ?35 Median Palm - Wrist 8       Ulnar Palm Wrist 1.8 ?2.2 32 ?12 Ulnar Palm - Wrist 8          Median Palm - Ulnar Palm  0.1 ?0.4  R Median - Orthodromic (Dig II, Mid palm)     Dig II Wrist 2.7 ?3.4 11 ?  10 Dig II - Wrist 13    R Ulnar - Orthodromic, (Dig V, Mid palm)     Dig V Wrist 2.5 ?3.1 10 ?5 Dig V - Wrist 35                 F  Wave    Nerve F Lat Ref.   ms ms  R Tibial - AH 50.4 ?56.0  R Ulnar - ADM 27.6 ?32.0         EMG Summary Table    Spontaneous MUAP Recruitment  Muscle IA Fib PSW Fasc Other Amp Dur. Poly Pattern  R. Deltoid Normal None None None _______ Normal Normal Normal Normal  R. Biceps brachii Normal None None None _______ Normal Normal Normal Normal  R. Triceps brachii Normal None None None _______ Normal Normal Normal Normal  R. Flexor carpi radialis Normal None None None _______ Normal Normal Normal Normal  R. First dorsal interosseous Normal None None None _______ Normal Normal Normal Normal

## 2020-09-27 ENCOUNTER — Telehealth: Payer: Self-pay | Admitting: Neurology

## 2020-09-27 DIAGNOSIS — R202 Paresthesia of skin: Secondary | ICD-10-CM

## 2020-09-27 NOTE — Telephone Encounter (Signed)
    I called the patient.  EMG nerve conduction study is unremarkable, no evidence of Charcot-Marie-Tooth disease.  We will get MRI of the brain and cervical spine to evaluate the source of the paresthesias reported by the patient.  Need to rule out demyelinating disease.

## 2020-09-30 ENCOUNTER — Telehealth: Payer: Self-pay | Admitting: Neurology

## 2020-09-30 NOTE — Telephone Encounter (Signed)
MRI brain w/wo contrast & MRI cervical spine w/wo contrast sent to GI for scheduling- they obtain Drucie Opitz

## 2020-10-18 ENCOUNTER — Ambulatory Visit
Admission: RE | Admit: 2020-10-18 | Discharge: 2020-10-18 | Disposition: A | Discharge status: Home / Self Care | Payer: 59 | Source: Ambulatory Visit | Attending: Neurology | Admitting: Neurology

## 2020-10-18 DIAGNOSIS — R202 Paresthesia of skin: Secondary | ICD-10-CM

## 2020-10-18 MED ORDER — GADOBENATE DIMEGLUMINE 529 MG/ML IV SOLN
13.0000 mL | Freq: Once | INTRAVENOUS | Status: AC | PRN
  Administered 2020-10-18: 13 mL via INTRAVENOUS

## 2020-10-20 ENCOUNTER — Telehealth: Payer: Self-pay | Admitting: Neurology

## 2020-10-20 NOTE — Telephone Encounter (Signed)
I called the patient.  MRI of the brain is unremarkable, MRI of the cervical spine shows no cord lesions, small disc protrusion on the right at C3-4 without nerve or spinal cord compression.  Nothing that would explain paresthesias all 4 extremities.  Blood work and nerve conduction studies done previously were unremarkable.  The patient will contact me if he believes that his clinical situation is changing.    MRI brain 10/18/20:  IMPRESSION: This is a normal MRI of the brain with and without contrast.    MRI cervical 10/18/20:  IMPRESSION: This MRI of the cervical spine with and without contrast shows the following: 1.   The spinal cord is normal. 2.   Right paramedian disc protrusion at C3-C4.  No spinal cord compression or nerve root compression. 3.   Normal enhancement pattern.

## 2020-10-23 ENCOUNTER — Ambulatory Visit: Payer: 59 | Admitting: Cardiology

## 2021-07-10 ENCOUNTER — Ambulatory Visit (INDEPENDENT_AMBULATORY_CARE_PROVIDER_SITE_OTHER): Payer: BC Managed Care – PPO | Admitting: Physician Assistant

## 2021-07-10 ENCOUNTER — Encounter: Payer: Self-pay | Admitting: Physician Assistant

## 2021-07-10 VITALS — BP 122/73 | HR 73 | Ht 71.0 in | Wt 138.6 lb

## 2021-07-10 DIAGNOSIS — F319 Bipolar disorder, unspecified: Secondary | ICD-10-CM | POA: Diagnosis not present

## 2021-07-10 DIAGNOSIS — F902 Attention-deficit hyperactivity disorder, combined type: Secondary | ICD-10-CM

## 2021-07-10 MED ORDER — LAMOTRIGINE 150 MG PO TABS: 150.0000 mg | ORAL_TABLET | Freq: Every day | ORAL | 1 refills | Status: DC

## 2021-07-10 MED ORDER — GUANFACINE HCL ER 1 MG PO TB24: 1.0000 mg | ORAL_TABLET | Freq: Every day | ORAL | 1 refills | Status: DC

## 2021-07-10 NOTE — Progress Notes (Signed)
[facility] MD/PA/NP Initial Note ? ?07/10/2021 4:50 PM ?Stephen Downs  ?MRN:  [id number] ? ?Chief Complaint:  ?Chief Complaint   ?Establish Care ?  ? ? ?HPI:  ?Former patient of mine who was seeing another provider after he moved to the [place] area several years ago.  Moved back to the area and is here to reestablish care.  Has known ADHD and Bipolar d/o.   ? ?He feels that his mood is well controlled.  No major ups or downs. Patient denies loss of interest in usual activities and is able to enjoy things.  Denies decreased energy.  Denies decreased motivation.  He is getting married next month and is excited about it but also nervous.  Work is going well.  Taking online classes in dog training.  That is going well also.  ADLs and personal hygiene are normal.  Appetite has not changed.  Weight is stable.  No extreme sadness, tearfulness, or feelings of hopelessness.  Sleeps well most of the time.  Denies cutting or any form of self-harm.  Denies suicidal or homicidal thoughts. ? ?Patient denies increased energy with decreased need for sleep, no increased talkativeness, no racing thoughts, no impulsivity or risky behaviors, no increased spending, no increased libido, no grandiosity, no increased irritability or anger, no paranoia, and no hallucinations. ? ?ADHD is not controlled and he would like to discuss treatment options.  He has only ever taken Intuniv.  It has been so long ago he does not remember if it helped or not.  States he starts things, gets distracted really easily and never finishes one project before going on to the next.  Pretty soon there are multiple things going on and he does not complete anything.  It is affecting his schoolwork as well as his home life. ? ?States he will need a form completed for the [employer].  He is not really sure why.  States he has not been in any accidents.  Has needed a form for many years now, he does not understand it. ? ? ?Visit Diagnosis:  ?  ICD-10-CM   ?1.  Bipolar I disorder (HCC)  F31.9   ?  ?2. Attention deficit hyperactivity disorder (ADHD), combined type  F90.2   ?  ? ? ?Past Psychiatric History:  ? ?No psych hospitalizations. No suicide attempts.  ? ?Past medications for mental health diagnoses include: ?Buspar, depakote, Intuniv, Lamictal  ? ?Past Medical History:  ?Past Medical History:  ?Diagnosis Date  ? ADHD   ? Anxiety   ? Bilateral numbness and tingling of arms and legs 06/21/2020  ? Bipolar 1 disorder (HCC)   ? Encounter to establish care 06/21/2020  ? Family history of Charcot-Marie-Tooth disease 06/21/2020  ? History of syncope 06/21/2020  ? Laboratory tests ordered as part of a complete physical exam (CPE) 06/21/2020  ? Positional lightheadedness 06/21/2020  ?  ?Past Surgical History:  ?Procedure Laterality Date  ? NO PAST SURGERIES    ? ? ?Family Psychiatric History: See below ? ?Family History:  ?Family History  ?Problem Relation Age of Onset  ? Diabetes Mother   ? Diabetes Father   ? Alcohol abuse Father   ? Bipolar disorder Father   ? Stroke Father   ? Charcot-Marie-Tooth disease Paternal Uncle   ? Cerebral aneurysm Maternal Grandfather   ? Aneurysm Maternal Grandfather   ? Thyroid cancer Maternal Grandmother   ? Charcot-Marie-Tooth disease Paternal Grandfather   ? Prostate cancer Maternal Great-grandfather   ? ? ?Social History:  ?  Social History  ? ?Socioeconomic History  ? Marital status: Single  ?  Spouse name: Not on file  ? Number of children: Not on file  ? Years of education: Not on file  ? Highest education level: Some college, no degree  ?Occupational History  ? Not on file  ?Tobacco Use  ? Smoking status: Every Day  ?  Types: E-cigarettes  ? Smokeless tobacco: Never  ?Vaping Use  ? Vaping Use: Every day  ? Substances: Nicotine, CBD  ?Substance and Sexual Activity  ? Alcohol use: Yes  ?  Comment: 1-2 beers per month  ? Drug use: Yes  ?  Types: Marijuana  ?  Comment: 3-4 times per month  ? Sexual activity: Yes  ?  Birth control/protection:  Pill  ?Other Topics Concern  ? Not on file  ?Social History Narrative  ? Grew up in [place]. Parents together. Only child. Good childhood but dad was alcoholic. No h/o abuse.   ?   ? He's engaged. 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  ?   ? Legal-no  ? Caffeine-8 oz coffee, and 1 soft drink most of the time.   ? Religious-agnostic  ? ?Social Determinants of Health  ? ?Financial Resource Strain: Low Risk   ? Difficulty of Paying Living Expenses: Not hard at all  ?Food Insecurity: No Food Insecurity  ? Worried About Programme researcher, broadcasting/film/video in the Last Year: Never true  ? Ran Out of Food in the Last Year: Never true  ?Transportation Needs: No Transportation Needs  ? Lack of Transportation (Medical): No  ? Lack of Transportation (Non-Medical): No  ?Physical Activity: Insufficiently Active  ? Days of Exercise per Week: 3 days  ? Minutes of Exercise per Session: 30 min  ?Stress: Stress Concern Present  ? Feeling of Stress : Rather much  ?Social Connections: Moderately Isolated  ? Frequency of Communication with Friends and Family: More than three times a week  ? Frequency of Social Gatherings with Friends and Family: Twice a week  ? Attends Religious Services: Never  ? Active Member of Clubs or Organizations: No  ? Attends Banker Meetings: More than 4 times per year  ? Marital Status: Never married  ? ? ?Allergies: No Known Allergies ? ?Metabolic Disorder Labs: ?Lab Results  ?Component Value Date  ? HGBA1C 5.1 11/10/2013  ? ?No results found for: PROLACTIN ?Lab Results  ?Component Value Date  ? CHOL 150 06/21/2020  ? TRIG 89 06/21/2020  ? HDL 49 06/21/2020  ? CHOLHDL 3.1 06/21/2020  ? VLDL 18 06/21/2020  ? LDLCALC 83 06/21/2020  ? ?Lab Results  ?Component Value Date  ? TSH 1.530 06/21/2020  ? TSH 1.41 11/10/2013  ? ? ?Therapeutic Level Labs: ?No results found for: LITHIUM ?No results found for: VALPROATE ?No components found for:  CBMZ ? ?Current Medications: ?Current  Outpatient Medications  ?Medication Sig Dispense Refill  ? guanFACINE (INTUNIV) 1 MG TB24 ER tablet Take 1 tablet (1 mg total) by mouth at bedtime. 30 tablet 1  ? lamoTRIgine (LAMICTAL) 150 MG tablet Take 1 tablet (150 mg total) by mouth daily. 90 tablet 1  ? ?No current facility-administered medications for this visit.  ? ? ?Medication Side Effects: none ? ?Orders placed this visit:  No orders of the defined types were placed in this encounter. ? ? ?Psychiatric Specialty Exam: ? ?Review of Systems  ?Constitutional: Negative.   ?HENT: Negative.    ?  Eyes: Negative.   ?Respiratory: Negative.    ?Cardiovascular: Negative.   ?Gastrointestinal: Negative.   ?Endocrine: Negative.   ?Genitourinary: Negative.   ?Musculoskeletal: Negative.   ?Skin: Negative.   ?Allergic/Immunologic: Negative.   ?Neurological: Negative.   ?Hematological: Negative.   ?Psychiatric/Behavioral:    ?     See HPI   ?Blood pressure 122/73, pulse 73, height 5\' 11"  (1.803 m), weight 138 lb 9.6 oz (62.9 kg).Body mass index is 19.33 kg/m?.  ?General Appearance: Casual and Fairly Groomed  ?Eye Contact:  Good  ?Speech:  Clear and Coherent and Normal Rate  ?Volume:  Normal  ?Mood:  Euthymic  ?Affect:  Congruent  ?Thought Process:  Goal Directed and Descriptions of Associations: Circumstantial  ?Orientation:  Full (Time, Place, and Person)  ?Thought Content: Logical   ?Suicidal Thoughts:  No  ?Homicidal Thoughts:  No  ?Memory:  WNL  ?Judgement:  Good  ?Insight:  Good  ?Psychomotor Activity:  Normal  ?Concentration:  Concentration: Fair and Attention Span: Fair  ?Recall:  Good  ?Fund of Knowledge: Good  ?Language: Good  ?Assets:  Desire for Improvement ?Financial Resources/Insurance ?Housing ?Transportation ?Vocational/Educational  ?ADL's:  Intact  ?Cognition: WNL  ?Prognosis:  Good  ? ?Screenings:  ?GAD-7   ? ?Flowsheet Row Office Visit from 07/10/2021 in [facility] Office Visit from 06/21/2020 in [facility]  ?Total GAD-7 Score 6 4  ? ?  ? ?PHQ2-9   ? ?Flowsheet Row Office Visit from 07/10/2021 in [facility]

## 2021-07-14 ENCOUNTER — Encounter (HOSPITAL_BASED_OUTPATIENT_CLINIC_OR_DEPARTMENT_OTHER): Payer: 59 | Admitting: Nurse Practitioner

## 2021-07-14 ENCOUNTER — Encounter (HOSPITAL_BASED_OUTPATIENT_CLINIC_OR_DEPARTMENT_OTHER): Payer: Self-pay

## 2021-08-01 ENCOUNTER — Other Ambulatory Visit: Payer: Self-pay | Admitting: Physician Assistant

## 2021-08-04 DIAGNOSIS — Z0289 Encounter for other administrative examinations: Secondary | ICD-10-CM

## 2021-08-04 NOTE — Telephone Encounter (Signed)
Of course asking for 90 day. He follow ups on 6/14

## 2021-08-13 ENCOUNTER — Ambulatory Visit: Payer: BC Managed Care – PPO | Admitting: Physician Assistant

## 2021-08-13 ENCOUNTER — Encounter: Payer: Self-pay | Admitting: Physician Assistant

## 2021-08-13 ENCOUNTER — Ambulatory Visit (INDEPENDENT_AMBULATORY_CARE_PROVIDER_SITE_OTHER): Payer: BC Managed Care – PPO | Admitting: Physician Assistant

## 2021-08-13 VITALS — BP 116/68 | HR 73

## 2021-08-13 DIAGNOSIS — F319 Bipolar disorder, unspecified: Secondary | ICD-10-CM | POA: Diagnosis not present

## 2021-08-13 DIAGNOSIS — F902 Attention-deficit hyperactivity disorder, combined type: Secondary | ICD-10-CM

## 2021-08-13 MED ORDER — ATOMOXETINE HCL 25 MG PO CAPS: 25.0000 mg | ORAL_CAPSULE | Freq: Every day | ORAL | 1 refills | Status: DC

## 2021-08-13 NOTE — Progress Notes (Signed)
[facility]  Patient ID: Stephen Downs,  MRN: [id number]  PCP: Tollie Eth, NP  Date of Evaluation: 08/17/2021 Time spent:20 minutes  Chief Complaint:  Chief Complaint   ADHD; Depression; Follow-up     HISTORY/CURRENT STATUS: HPI For routine med check.   Re-started guanfacine for ADD at the last OV.  He has not noticed any difference in concentration. Still gets distracted easily. Has felt dizzy off and on, especially when he stands up quickly, feels a 'swoosh' in his ears and tinnitus that goes away fairly quickly, but it's unsettling. Still unable to focus. Gets distracted easily. Affects his work.   Patient denies loss of interest in usual activities and is able to enjoy things. Got married a few weeks ago and is very happy.   ADLs and personal hygiene are normal.  Appetite has not changed.  Weight is stable.  No extreme sadness, tearfulness, or feelings of hopelessness.  Sleeps well most of the time. Not having a lot of anxiety. Denies suicidal or homicidal thoughts.  Patient denies increased energy with decreased need for sleep, no increased talkativeness, no racing thoughts, no impulsivity or risky behaviors, no increased spending, no increased libido, no grandiosity, no increased irritability or anger, and no hallucinations.  Denies dizziness, syncope, seizures, numbness, tingling, tremor, tics, unsteady gait, slurred speech, confusion. Denies muscle or joint pain, stiffness, or dystonia.  Individual Medical History/ Review of Systems: Changes? :No   No psych hospitalizations. No suicide attempts.    Past medications for mental health diagnoses include: Buspar, depakote, Intuniv, Lamictal, Guanafine caused dizziness  Allergies: Patient has no known allergies.  Current Medications:  Current Outpatient Medications:    atomoxetine (STRATTERA) 25 MG capsule, Take 1 capsule (25 mg total) by mouth daily., Disp: 30 capsule, Rfl: 1   lamoTRIgine  (LAMICTAL) 150 MG tablet, Take 1 tablet (150 mg total) by mouth daily., Disp: 90 tablet, Rfl: 1 Medication Side Effects: dizziness/lightheadedness with guanfacine  Family Medical/ Social History: Changes? Yes got married.  MENTAL HEALTH EXAM:  Blood pressure 116/68, pulse 73.There is no height or weight on file to calculate BMI.  General Appearance: Casual and Well Groomed  Eye Contact:  Good  Speech:  Clear and Coherent and Normal Rate  Volume:  Normal  Mood:  Euthymic  Affect:  Congruent  Thought Process:  Goal Directed and Descriptions of Associations: Circumstantial  Orientation:  Full (Time, Place, and Person)  Thought Content: Logical   Suicidal Thoughts:  No  Homicidal Thoughts:  No  Memory:  WNL  Judgement:  Good  Insight:  Good  Psychomotor Activity:  Normal  Concentration:  Concentration: Fair  Recall:  Good  Fund of Knowledge: Good  Language: Good  Assets:  Desire for Improvement Financial Resources/Insurance Housing Transportation Vocational/Educational  ADL's:  Intact  Cognition: WNL  Prognosis:  Good    DIAGNOSES:    ICD-10-CM   1. Bipolar I disorder (HCC)  F31.9     2. Attention deficit hyperactivity disorder (ADHD), combined type  F90.2       Receiving Psychotherapy: No    RECOMMENDATIONS:  PDMP reviewed.  No controlled substances. I provided 20 minutes of face to face time during this encounter, including time spent before and after the visit in records review, medical decision making, counseling pertinent to today's visit, and charting.  Discussed options for ADHD. Recommend changing to Strattera. Will start at low dose and increase if needed. Benefits, risks, SE disc and he accepts. I prefer not  to use a stimulant if possible, might cause mania. He understands.   Stop guanfacine due to orthostatic hypotension. Start Strattera 25 mg, 1 p.o. every morning. Continue Lamictal 150 mg, 1 qd. Return in 4 weeks.  Melony Overly, PA-C

## 2021-08-17 ENCOUNTER — Encounter: Payer: Self-pay | Admitting: Physician Assistant

## 2021-08-19 ENCOUNTER — Encounter (HOSPITAL_BASED_OUTPATIENT_CLINIC_OR_DEPARTMENT_OTHER): Payer: 59 | Admitting: Nurse Practitioner

## 2021-08-19 ENCOUNTER — Encounter (HOSPITAL_BASED_OUTPATIENT_CLINIC_OR_DEPARTMENT_OTHER): Payer: Self-pay

## 2021-09-05 ENCOUNTER — Other Ambulatory Visit: Payer: Self-pay | Admitting: Physician Assistant

## 2021-09-16 ENCOUNTER — Ambulatory Visit: Payer: BC Managed Care – PPO | Admitting: Physician Assistant

## 2021-09-16 ENCOUNTER — Encounter: Payer: Self-pay | Admitting: Physician Assistant

## 2021-09-16 DIAGNOSIS — F902 Attention-deficit hyperactivity disorder, combined type: Secondary | ICD-10-CM | POA: Diagnosis not present

## 2021-09-16 DIAGNOSIS — F319 Bipolar disorder, unspecified: Secondary | ICD-10-CM

## 2021-09-16 MED ORDER — ATOMOXETINE HCL 40 MG PO CAPS: 40.0000 mg | ORAL_CAPSULE | Freq: Every day | ORAL | 1 refills | Status: DC

## 2021-09-16 NOTE — Progress Notes (Signed)
[facility]  Patient ID: Stephen Downs,  MRN: [id number]  PCP: Tollie Eth, NP  Date of Evaluation: 09/16/2021 Time spent:20 minutes  Chief Complaint:  Chief Complaint   Depression; ADD; Follow-up    HISTORY/CURRENT STATUS: HPI For routine med check.   We started Strattera 1 month ago.  He has not been able to tell any difference as far as focus and attention go.  He gets distracted easily and has a hard time finishing things that he says has started.  We had tried guanfacine a few months ago but it caused orthostatic hypotension.  Have not tried a stimulant, mostly because when he initially presented a few months ago he was getting married soon and I did not want to risk a manic episode.  He is not having any manic symptoms, not sure he has ever had a full-blown 1, only anger and irritability when it feels warranted.  Patient denies loss of interest in usual activities and is able to enjoy things.  Denies decreased energy.  Denies decreased motivation.  Work is going well.  ADLs and personal hygiene are normal.  Appetite has not changed.  Weight is stable.   No extreme sadness, tearfulness, or feelings of hopelessness.  Sleeps well most of the time.  No complaints of anxiety denies suicidal or homicidal thoughts.  No increased energy with decreased need for sleep, no increased talkativeness, no racing thoughts, no impulsivity or risky behaviors, no increased spending, no increased libido, no grandiosity, no increased irritability or anger, and no hallucinations.  Denies dizziness, syncope, seizures, numbness, tingling, tremor, tics, unsteady gait, slurred speech, confusion. Denies muscle or joint pain, stiffness, or dystonia.  Individual Medical History/ Review of Systems: Changes? :No   No psych hospitalizations. No suicide attempts.    Past medications for mental health diagnoses include: Buspar, depakote, Intuniv, Lamictal, Guanafine caused dizziness, took  Adderall a few times in college from some friends.  Allergies: Patient has no known allergies.  Current Medications:  Current Outpatient Medications:    atomoxetine (STRATTERA) 40 MG capsule, Take 1 capsule (40 mg total) by mouth daily., Disp: 30 capsule, Rfl: 1   lamoTRIgine (LAMICTAL) 150 MG tablet, Take 1 tablet (150 mg total) by mouth daily., Disp: 90 tablet, Rfl: 1 Medication Side Effects: dizziness/lightheadedness with guanfacine  Family Medical/ Social History: Changes? Yes got married.  MENTAL HEALTH EXAM:  There were no vitals taken for this visit.There is no height or weight on file to calculate BMI.  General Appearance: Casual and Well Groomed  Eye Contact:  Good  Speech:  Clear and Coherent and Normal Rate  Volume:  Normal  Mood:  Euthymic  Affect:  Congruent  Thought Process:  Goal Directed and Descriptions of Associations: Circumstantial  Orientation:  Full (Time, Place, and Person)  Thought Content: Logical   Suicidal Thoughts:  No  Homicidal Thoughts:  No  Memory:  WNL  Judgement:  Good  Insight:  Good  Psychomotor Activity:  Normal  Concentration:  Concentration: Fair and Attention Span: Fair  Recall:  Good  Fund of Knowledge: Good  Language: Good  Assets:  Desire for Improvement Financial Resources/Insurance Housing Transportation Vocational/Educational  ADL's:  Intact  Cognition: WNL  Prognosis:  Good    DIAGNOSES:    ICD-10-CM   1. Attention deficit hyperactivity disorder (ADHD), combined type  F90.2     2. Bipolar I disorder (HCC)  F31.9      Receiving Psychotherapy: No    RECOMMENDATIONS:  PDMP  reviewed.  No controlled substances.  I provided 20 minutes of face to face time during this encounter, including time spent before and after the visit in records review, medical decision making, counseling pertinent to today's visit, and charting.   Discussed the different options.  Recommend increasing the Strattera as I started a low dose to  begin with.  He did have mild nausea for a few days after starting it so if that is a problem and does not resolve in a a week at the latest then he should call and we will stop the Strattera. We also discussed starting a stimulant.  It is difficult to find right now due to a national shortage so that is the main reason I am not recommending it right now.  We may end up having to try one though.  I am okay to do that since I do not have to be as concerned about the potential for mania.  I doubt that it would happen anyway but will still be cautious.  He understands.  Increase Strattera to 40 mg, 1 p.o. every morning. Continue Lamictal 150 mg, 1 qd. Return in 4 weeks.  Melony Overly, PA-C

## 2021-10-08 ENCOUNTER — Other Ambulatory Visit: Payer: Self-pay | Admitting: Physician Assistant

## 2021-10-14 ENCOUNTER — Ambulatory Visit: Payer: BC Managed Care – PPO | Admitting: Physician Assistant

## 2021-10-14 ENCOUNTER — Encounter: Payer: Self-pay | Admitting: Physician Assistant

## 2021-10-14 DIAGNOSIS — F319 Bipolar disorder, unspecified: Secondary | ICD-10-CM

## 2021-10-14 DIAGNOSIS — F902 Attention-deficit hyperactivity disorder, combined type: Secondary | ICD-10-CM | POA: Diagnosis not present

## 2021-10-14 MED ORDER — AMPHETAMINE-DEXTROAMPHET ER 10 MG PO CP24: 10.0000 mg | ORAL_CAPSULE | Freq: Every day | ORAL | 0 refills | Status: DC

## 2021-10-14 NOTE — Progress Notes (Signed)
[facility]  Patient ID: Stephen Downs,  MRN: [id number]  PCP: Tollie Eth, NP  Date of Evaluation: 10/14/2021 Time spent:20 minutes  Chief Complaint:  Chief Complaint   ADD; Depression; Follow-up    HISTORY/CURRENT STATUS: HPI For routine med check.   We increased Strattera at the last visit.  He has not had any improvement at all with his focus.  Has a hard time staying on task.  He did take Adderall in college, it was helpful, he does not know what dose it was.  Took Intuniv which caused dizziness.  Patient is able to enjoy things.  Energy and motivation are good.  Work is going well.   No extreme sadness, tearfulness, or feelings of hopelessness.  Sleeps well most of the time. ADLs and personal hygiene are normal.   Appetite has not changed.  Weight is stable.  Not anxious. Denies suicidal or homicidal thoughts.  Denies dizziness, syncope, seizures, numbness, tingling, tremor, tics, unsteady gait, slurred speech, confusion. Denies muscle or joint pain, stiffness, or dystonia.  Individual Medical History/ Review of Systems: Changes? :No   No psych hospitalizations. No suicide attempts.    Past medications for mental health diagnoses include: Buspar, depakote, Lamictal, Guanfacine caused dizziness, took Adderall a few times in college from some friends.  Strattera has not been effective at mid range dose.  Allergies: Patient has no known allergies.  Current Medications:  Current Outpatient Medications:    amphetamine-dextroamphetamine (ADDERALL XR) 10 MG 24 hr capsule, Take 1 capsule (10 mg total) by mouth daily., Disp: 30 capsule, Rfl: 0   lamoTRIgine (LAMICTAL) 150 MG tablet, Take 1 tablet (150 mg total) by mouth daily., Disp: 90 tablet, Rfl: 1 Medication Side Effects: dizziness/lightheadedness with guanfacine  Family Medical/ Social History: Changes? Yes got married.  MENTAL HEALTH EXAM:  There were no vitals taken for this visit.There is no  height or weight on file to calculate BMI.  General Appearance: Casual and Well Groomed  Eye Contact:  Good  Speech:  Clear and Coherent and Normal Rate  Volume:  Normal  Mood:  Euthymic  Affect:  Congruent  Thought Process:  Goal Directed and Descriptions of Associations: Circumstantial  Orientation:  Full (Time, Place, and Person)  Thought Content: Logical   Suicidal Thoughts:  No  Homicidal Thoughts:  No  Memory:  WNL  Judgement:  Good  Insight:  Good  Psychomotor Activity:  Normal  Concentration:  Concentration: Fair and Attention Span: Poor  Recall:  Good  Fund of Knowledge: Good  Language: Good  Assets:  Desire for Improvement Financial Resources/Insurance Housing Transportation Vocational/Educational  ADL's:  Intact  Cognition: WNL  Prognosis:  Good    DIAGNOSES:    ICD-10-CM   1. Attention deficit hyperactivity disorder (ADHD), combined type  F90.2     2. Bipolar I disorder (HCC)  F31.9       Receiving Psychotherapy: No    RECOMMENDATIONS:  PDMP reviewed.  No controlled substances.  I provided 20 minutes of face to face time during this encounter, including time spent before and after the visit in records review, medical decision making, counseling pertinent to today's visit, and charting.   We discussed his symptoms.  I think he would have felt something already if the Strattera was going to work at all.  We agreed to wean off that by having him open up the capsule, sprinkle is close to one half contents in a teaspoon of applesauce or something like that, take  1/2 daily for the next 6 to 8 days (his current supply) and then stop it.    At the same time we will start Adderall.  Benefits, risks and side effects were discussed and he accepts.  If he starts having any manic symptoms whatsoever he will call and we will stop the Adderall immediately.  No manic symptoms in years.  Start Adderall XR 10 mg, 1 p.o. every morning. Continue Lamictal 150 mg, 1  qd. Return in 4 weeks.  Melony Overly, PA-C

## 2021-11-13 ENCOUNTER — Telehealth: Payer: Self-pay | Admitting: Physician Assistant

## 2021-11-13 NOTE — Telephone Encounter (Signed)
Stephen Downs lvm requesting a refill on his Adderall. A follow up appointment is scheduled for 9/18, but patient will need medication before seen.  Contact information # [phone]

## 2021-11-14 ENCOUNTER — Other Ambulatory Visit: Payer: Self-pay

## 2021-11-14 ENCOUNTER — Ambulatory Visit: Payer: BC Managed Care – PPO | Admitting: Physician Assistant

## 2021-11-14 MED ORDER — AMPHETAMINE-DEXTROAMPHET ER 10 MG PO CP24
10.0000 mg | ORAL_CAPSULE | Freq: Every day | ORAL | 0 refills | Status: DC
Start: 2021-11-14 — End: 2021-11-25

## 2021-11-14 NOTE — Telephone Encounter (Signed)
Pended.

## 2021-11-14 NOTE — Telephone Encounter (Signed)
[place] get [place]. Called pharmacy. Patient picked up on 8/16.

## 2021-11-17 ENCOUNTER — Ambulatory Visit: Payer: BC Managed Care – PPO | Admitting: Physician Assistant

## 2021-11-17 ENCOUNTER — Encounter: Payer: Self-pay | Admitting: Physician Assistant

## 2021-11-17 VITALS — BP 110/72 | HR 68

## 2021-11-17 DIAGNOSIS — F902 Attention-deficit hyperactivity disorder, combined type: Secondary | ICD-10-CM | POA: Diagnosis not present

## 2021-11-17 DIAGNOSIS — F319 Bipolar disorder, unspecified: Secondary | ICD-10-CM

## 2021-11-17 NOTE — Progress Notes (Signed)
[facility]  Patient ID: Stephen Downs,  MRN: [id number]  PCP: Orma Render, NP  Date of Evaluation: 11/17/2021 Time spent:20 minutes  Chief Complaint:  Chief Complaint   ADD; Depression; Follow-up    HISTORY/CURRENT STATUS: HPI For routine med check.   We stopped the Strattera and started Adderall.  It has been helpful but wonders if we could increase the dose.  He is not focusing as well as he would like.  Still has a hard time staying on task and getting things done like he needs to.  Patient is able to enjoy things.  Energy and motivation are good.  Work is going well.   No extreme sadness, tearfulness, or feelings of hopelessness.  Sleeps well most of the time. ADLs and personal hygiene are normal.  Appetite has not changed.  Weight is stable.  Not isolating.  Not crying easily.  Anxiety is not an issue now.  Denies suicidal or homicidal thoughts.  Still has irritability but not as bad as it was before going on the Adderall.  Since he is able to focus more he does not get as uptight like he did.  Patient denies increased energy with decreased need for sleep, increased talkativeness, racing thoughts, impulsivity or risky behaviors, increased spending, increased libido, grandiosity, paranoia, or hallucinations.  Denies dizziness, syncope, seizures, numbness, tingling, tremor, tics, unsteady gait, slurred speech, confusion. Denies muscle or joint pain, stiffness, or dystonia.  Individual Medical History/ Review of Systems: Changes? :Yes hurt his right knee somehow.  Under the care of ortho.  No psych hospitalizations. No suicide attempts.    Past medications for mental health diagnoses include: Buspar, depakote, Lamictal, Guanfacine caused dizziness, took Adderall a few times in college from some friends.  Strattera has not been effective at mid range dose.  Allergies: Patient has no known allergies.  Current Medications:  Current Outpatient Medications:     amphetamine-dextroamphetamine (ADDERALL XR) 10 MG 24 hr capsule, Take 1 capsule (10 mg total) by mouth daily., Disp: 30 capsule, Rfl: 0   lamoTRIgine (LAMICTAL) 150 MG tablet, Take 1 tablet (150 mg total) by mouth daily., Disp: 90 tablet, Rfl: 1 Medication Side Effects: dizziness/lightheadedness with guanfacine  Family Medical/ Social History: Changes? Bought a house  MENTAL HEALTH EXAM:  Blood pressure 110/72, pulse 68.There is no height or weight on file to calculate BMI.  General Appearance: Casual, Well Groomed, and wearing a knee brace, right leg  Eye Contact:  Good  Speech:  Clear and Coherent and Normal Rate  Volume:  Normal  Mood:  Euthymic  Affect:  Congruent  Thought Process:  Goal Directed and Descriptions of Associations: Circumstantial  Orientation:  Full (Time, Place, and Person)  Thought Content: Logical   Suicidal Thoughts:  No  Homicidal Thoughts:  No  Memory:  WNL  Judgement:  Good  Insight:  Good  Psychomotor Activity:  Normal  Concentration:  Concentration: Fair and Attention Span: Fair  Recall:  Good  Fund of Knowledge: Good  Language: Good  Assets:  Desire for Improvement Financial Resources/Insurance Housing Transportation Vocational/Educational  ADL's:  Intact  Cognition: WNL  Prognosis:  Good   DIAGNOSES:    ICD-10-CM   1. Attention deficit hyperactivity disorder (ADHD), combined type  F90.2     2. Bipolar I disorder ([place])  F31.9       Receiving Psychotherapy: No   RECOMMENDATIONS:  PDMP reviewed.  No controlled substances.  I provided 20 minutes of face to face time during  this encounter, including time spent before and after the visit in records review, medical decision making, counseling pertinent to today's visit, and charting.   We discussed the Adderall.  I recommend increasing the dose to 15 mg.  Had it filled just a few days ago.  It does not show up on PDMP.  We can increase the dose but he has to bring in the Adderall XR 10 mg  first.  He agrees to bring in the Adderall XR 10 mg for [place] to hold, once we have that then [facility] [address].  Will increase Adderall XR to 15 mg, 1 p.o. every morning. Continue Lamictal 150 mg, 1 qd. Return in 4-6 weeks.  Donnal Moat, PA-C

## 2021-11-25 ENCOUNTER — Other Ambulatory Visit: Payer: Self-pay

## 2021-11-25 MED ORDER — AMPHETAMINE-DEXTROAMPHET ER 15 MG PO CP24: 15.0000 mg | ORAL_CAPSULE | ORAL | 0 refills | Status: DC

## 2021-11-25 NOTE — Telephone Encounter (Signed)
Pended.

## 2021-11-25 NOTE — Telephone Encounter (Signed)
Pt called at 11:09a.  He asked about new script for Adderall 15 mg that Helene Kelp was supposed to send in since he brought the Adderall 10mg  tablets back to the office last Thurs.  I asked him to check with the pharmacy to see if 15 mg was in stock.  He called back and said yes [facility]'s in stock at   [id number] [place], [address] [place] [address]  Phone:  [phone]  Fax:  [phone]   Next appt 10/23

## 2021-12-22 ENCOUNTER — Ambulatory Visit: Payer: BC Managed Care – PPO | Admitting: Physician Assistant

## 2021-12-22 ENCOUNTER — Encounter: Payer: Self-pay | Admitting: Physician Assistant

## 2021-12-22 DIAGNOSIS — F902 Attention-deficit hyperactivity disorder, combined type: Secondary | ICD-10-CM | POA: Diagnosis not present

## 2021-12-22 DIAGNOSIS — F319 Bipolar disorder, unspecified: Secondary | ICD-10-CM | POA: Diagnosis not present

## 2021-12-22 MED ORDER — AMPHETAMINE-DEXTROAMPHET ER 20 MG PO CP24: 20.0000 mg | ORAL_CAPSULE | Freq: Every morning | ORAL | 0 refills | Status: DC

## 2021-12-22 NOTE — Progress Notes (Signed)
[facility]  Patient ID: Stephen Downs,  MRN: [id number]  PCP: Orma Render, NP  Date of Evaluation: 12/22/2021 Time spent:20 minutes  Chief Complaint:  Chief Complaint   Follow-up     HISTORY/CURRENT STATUS: HPI For routine med check.   We increased Adderall a month ago.  It has helped but not quite as much as he would like for it to.  He is more able to stay on task than he was but thinks he might be feeling a little better.  Wonders if increasing the dose would be helpful.  Patient is able to enjoy things.  Energy and motivation are good.  Work is going well.   No extreme sadness, tearfulness, or feelings of hopelessness.  Sleeps well most of the time. ADLs and personal hygiene are normal.   Appetite has not changed.  Weight is stable.   Denies suicidal or homicidal thoughts.  Patient denies increased energy with decreased need for sleep, increased talkativeness, racing thoughts, impulsivity or risky behaviors, increased spending, increased libido, grandiosity, increased irritability or anger, paranoia, or hallucinations.  Denies dizziness, syncope, seizures, numbness, tingling, tremor, tics, unsteady gait, slurred speech, confusion. Denies muscle or joint pain, stiffness, or dystonia.  Individual Medical History/ Review of Systems: Changes? :No     No psych hospitalizations. No suicide attempts.    Past medications for mental health diagnoses include: Buspar, depakote, Lamictal, Guanfacine caused dizziness, took Adderall a few times in college from some friends.  Strattera has not been effective at mid range dose.  Allergies: Patient has no known allergies.  Current Medications:  Current Outpatient Medications:    amphetamine-dextroamphetamine (ADDERALL XR) 20 MG 24 hr capsule, Take 1 capsule (20 mg total) by mouth in the morning., Disp: 30 capsule, Rfl: 0   lamoTRIgine (LAMICTAL) 150 MG tablet, Take 1 tablet (150 mg total) by mouth daily., Disp: 90  tablet, Rfl: 1 Medication Side Effects: dizziness/lightheadedness with guanfacine  Family Medical/ Social History: Changes? no  MENTAL HEALTH EXAM:  There were no vitals taken for this visit.There is no height or weight on file to calculate BMI.  General Appearance: Casual and Well Groomed  Eye Contact:  Good  Speech:  Clear and Coherent and Normal Rate  Volume:  Normal  Mood:  Euthymic  Affect:  Congruent  Thought Process:  Goal Directed and Descriptions of Associations: Circumstantial  Orientation:  Full (Time, Place, and Person)  Thought Content: Logical   Suicidal Thoughts:  No  Homicidal Thoughts:  No  Memory:  WNL  Judgement:  Good  Insight:  Good  Psychomotor Activity:  Normal  Concentration:  Concentration: Fair and Attention Span: Fair  Recall:  Good  Fund of Knowledge: Good  Language: Good  Assets:  Desire for Improvement Financial Resources/Insurance Housing Transportation Vocational/Educational  ADL's:  Intact  Cognition: WNL  Prognosis:  Good   DIAGNOSES:    ICD-10-CM   1. Attention deficit hyperactivity disorder (ADHD), combined type  F90.2     2. Bipolar I disorder ([place])  F31.9       Receiving Psychotherapy: No   RECOMMENDATIONS:  PDMP reviewed.  No controlled substances.  I provided 20 minutes of face to face time during this encounter, including time spent before and after the visit in records review, medical decision making, counseling pertinent to today's visit, and charting.   It is appropriate to increase the dose of Adderall.  If it causes any side effects and he wants to decrease the dose then  he will bring in the remainder of the bottle for disposal and then I will send him the Adderall XR 15 mg again.  He verbalizes understanding.  He asks about having his dog registered as an emotional support animal.  He will go to the ADA website and certify his dog, bring in the certificate of previous to me and then I will dictate a letter stating his  dog is an emotional support animal.  Increase Adderall XR to 20 mg, 1 p.o. every morning. Continue Lamictal 150 mg, 1 qd. Return in 4 weeks.  Melony Overly, PA-C

## 2022-01-19 ENCOUNTER — Ambulatory Visit (INDEPENDENT_AMBULATORY_CARE_PROVIDER_SITE_OTHER): Payer: BC Managed Care – PPO | Admitting: Physician Assistant

## 2022-01-19 ENCOUNTER — Encounter: Payer: Self-pay | Admitting: Physician Assistant

## 2022-01-19 DIAGNOSIS — F902 Attention-deficit hyperactivity disorder, combined type: Secondary | ICD-10-CM | POA: Diagnosis not present

## 2022-01-19 DIAGNOSIS — F319 Bipolar disorder, unspecified: Secondary | ICD-10-CM | POA: Diagnosis not present

## 2022-01-19 MED ORDER — LAMOTRIGINE 150 MG PO TABS: 150.0000 mg | ORAL_TABLET | Freq: Every day | ORAL | 1 refills | Status: DC

## 2022-01-19 MED ORDER — AMPHETAMINE-DEXTROAMPHET ER 25 MG PO CP24: 25.0000 mg | ORAL_CAPSULE | ORAL | 0 refills | Status: DC

## 2022-01-19 NOTE — Progress Notes (Signed)
[facility]  Patient ID: Stephen Downs,  MRN: [id number]  PCP: Tollie Eth, NP  Date of Evaluation: 01/19/2022 Time spent:20 minutes  Chief Complaint:  Chief Complaint   Depression; ADHD; Follow-up    HISTORY/CURRENT STATUS: HPI For routine med check.   We increased Adderall at the last visit.  He can definitely feel a difference with his energy and motivation, and is able to focus better than he had been.  He wonders if a higher dose would be more effective.  He would like to at least try it and if it is not effective then go back to the present dose.  He still gets distracted easily.  Work is going well though, no issues  Patient is able to enjoy things.  No extreme sadness, tearfulness, or feelings of hopelessness.  Sleeps well most of the time. ADLs and personal hygiene are normal.  Appetite has not changed.  Weight is stable.   Denies suicidal or homicidal thoughts.  Patient denies increased energy with decreased need for sleep, increased talkativeness, racing thoughts, impulsivity or risky behaviors, increased spending, increased libido, grandiosity, increased irritability or anger, paranoia, or hallucinations.  Denies dizziness, syncope, seizures, numbness, tingling, tremor, tics, unsteady gait, slurred speech, confusion. Denies muscle or joint pain, stiffness, or dystonia.  Individual Medical History/ Review of Systems: Changes? :No     No psych hospitalizations. No suicide attempts.    Past medications for mental health diagnoses include: Buspar, depakote, Lamictal, Guanfacine caused dizziness, took Adderall a few times in college from some friends.  Strattera has not been effective at mid range dose.  Allergies: Patient has no known allergies.  Current Medications:  Current Outpatient Medications:    amphetamine-dextroamphetamine (ADDERALL XR) 25 MG 24 hr capsule, Take 1 capsule by mouth every morning., Disp: 30 capsule, Rfl: 0   lamoTRIgine  (LAMICTAL) 150 MG tablet, Take 1 tablet (150 mg total) by mouth daily., Disp: 90 tablet, Rfl: 1 Medication Side Effects: dizziness/lightheadedness with guanfacine  Family Medical/ Social History: Changes? no  MENTAL HEALTH EXAM:  There were no vitals taken for this visit.There is no height or weight on file to calculate BMI.  General Appearance: Casual and Well Groomed  Eye Contact:  Good  Speech:  Clear and Coherent and Normal Rate  Volume:  Normal  Mood:  Euthymic  Affect:  Congruent  Thought Process:  Goal Directed and Descriptions of Associations: Circumstantial  Orientation:  Full (Time, Place, and Person)  Thought Content: Logical   Suicidal Thoughts:  No  Homicidal Thoughts:  No  Memory:  WNL  Judgement:  Good  Insight:  Good  Psychomotor Activity:  Normal  Concentration:  Concentration: Good and Attention Span: Fair  Recall:  Good  Fund of Knowledge: Good  Language: Good  Assets:  Desire for Improvement Financial Resources/Insurance Housing Transportation Vocational/Educational  ADL's:  Intact  Cognition: WNL  Prognosis:  Good   DIAGNOSES:    ICD-10-CM   1. Attention deficit hyperactivity disorder (ADHD), combined type  F90.2     2. Bipolar I disorder ([facility])  F31.9      Receiving Psychotherapy: No   RECOMMENDATIONS:  PDMP reviewed.  No controlled substances.  I provided 20 minutes of face to face time during this encounter, including time spent before and after the visit in records review, medical decision making, counseling pertinent to today's visit, and charting.   It is appropriate to increase the Adderall dose just to see how he responds to it.  He can let me know in 3 weeks or so how he is doing with that dose and I will decide the dose to send in the next prescriptions.  He understands.  Increase Adderall XR to 25 mg, 1 p.o. every morning. Continue Lamictal 150 mg, 1 qd. Return in 3 months.  Melony Overly, PA-C

## 2022-01-25 ENCOUNTER — Other Ambulatory Visit: Payer: Self-pay | Admitting: Physician Assistant

## 2022-02-26 ENCOUNTER — Other Ambulatory Visit: Payer: Self-pay | Admitting: Physician Assistant

## 2022-02-26 MED ORDER — AMPHETAMINE-DEXTROAMPHET ER 30 MG PO CP24: 30.0000 mg | ORAL_CAPSULE | ORAL | 0 refills | Status: DC

## 2022-02-26 NOTE — Telephone Encounter (Signed)
Note from 11/20 stated " It is appropriate to increase the Adderall dose just to see how he responds to it.  He can let me know in 3 weeks or so how he is doing with that dose and I will decide the dose to send in the next prescriptions."  Adderall 30 mg has been on back order so it may be difficult to get it filled

## 2022-02-26 NOTE — Telephone Encounter (Signed)
Do you know how the supply for XR 10 mg is? Could give 3 pills/day. Or even XR 20 mg, and take 1 at breakfast, 1 around lunch. Thanks

## 2022-02-26 NOTE — Telephone Encounter (Signed)
Stephen Downs 12/27 at 2:29 to request refill of his Adderall but wants it increased to 30mg .  Appt 2/12.  Call if you need to discuss increase.  Did not specify pharmacy

## 2022-04-02 ENCOUNTER — Telehealth: Payer: Self-pay | Admitting: Physician Assistant

## 2022-04-02 ENCOUNTER — Other Ambulatory Visit: Payer: Self-pay

## 2022-04-02 NOTE — Telephone Encounter (Signed)
Pended.

## 2022-04-02 NOTE — Telephone Encounter (Signed)
Pt called at 3:30p.  He would like a refill of Adderall ER 30mg  sent to [facility]. He said they wouldn't tell him if it's in stock.  Next appt 2/12

## 2022-04-03 MED ORDER — AMPHETAMINE-DEXTROAMPHET ER 30 MG PO CP24: 30.0000 mg | ORAL_CAPSULE | ORAL | 0 refills | Status: DC

## 2022-04-13 ENCOUNTER — Ambulatory Visit (INDEPENDENT_AMBULATORY_CARE_PROVIDER_SITE_OTHER): Payer: 59 | Admitting: Physician Assistant

## 2022-04-13 ENCOUNTER — Encounter: Payer: Self-pay | Admitting: Physician Assistant

## 2022-04-13 DIAGNOSIS — F902 Attention-deficit hyperactivity disorder, combined type: Secondary | ICD-10-CM

## 2022-04-13 DIAGNOSIS — F319 Bipolar disorder, unspecified: Secondary | ICD-10-CM

## 2022-04-13 MED ORDER — OXCARBAZEPINE 300 MG PO TABS: ORAL_TABLET | ORAL | 1 refills | Status: DC

## 2022-04-13 NOTE — Progress Notes (Signed)
[facility]  Patient ID: Stephen Downs,  MRN: [id number]  PCP: Orma Render, NP  Date of Evaluation: 04/13/2022 Time spent:20 minutes  Chief Complaint:  Chief Complaint   ADHD; Depression; Stress    HISTORY/CURRENT STATUS: HPI For routine med check.   States he gets angry easily, has been going on for a long time. Short fuse.  He punched a wall a few years ago and unfortunately hit a stud, fracturing his hand.  Because of the easy anger right now he and his wife are having problems.  He took Depakote in the past when he was younger but does not remember if it helped or how much.  He is out of work right now because he fractured his patella.  Feels that is making the anger worse, he does not have anything to do but sit around.  He does not like that.  Patient is able to enjoy things.  Energy and motivation are good.   No extreme sadness, tearfulness, or feelings of hopelessness.  Sleeps well most of the time. ADLs and personal hygiene are normal.   Appetite has not changed.  Weight is stable.  No anxiety to speak of.  Denies suicidal or homicidal thoughts.  We have increased the Adderall since his last visit.  He has had problems with focus but again not sure how much the increased dose is helping right now since he is not working.  Patient denies increased energy with decreased need for sleep, increased talkativeness, racing thoughts, impulsivity or risky behaviors, increased spending, increased libido, grandiosity, paranoia, or hallucinations.  Denies dizziness, syncope, seizures, numbness, tingling, tremor, tics, unsteady gait, slurred speech, confusion. Denies muscle or joint pain, stiffness, or dystonia.  Individual Medical History/ Review of Systems: Changes? :Yes   fx and dislocated right patella     No psych hospitalizations. No suicide attempts.    Past medications for mental health diagnoses include: Buspar, depakote, Lamictal, Guanfacine caused  dizziness, took Adderall a few times in college from some friends.  Strattera has not been effective at mid range dose.  Allergies: Patient has no known allergies.  Current Medications:  Current Outpatient Medications:    amphetamine-dextroamphetamine (ADDERALL XR) 30 MG 24 hr capsule, Take 1 capsule (30 mg total) by mouth every morning., Disp: 30 capsule, Rfl: 0   lamoTRIgine (LAMICTAL) 150 MG tablet, Take 1 tablet (150 mg total) by mouth daily., Disp: 90 tablet, Rfl: 1   Oxcarbazepine (TRILEPTAL) 300 MG tablet, 1 po qhs for 4 nights, then 1 po bid, Disp: 60 tablet, Rfl: 1 Medication Side Effects: dizziness/lightheadedness with guanfacine  Family Medical/ Social History: Changes? no  MENTAL HEALTH EXAM:  There were no vitals taken for this visit.There is no height or weight on file to calculate BMI.  General Appearance: Casual and Well Groomed  Eye Contact:  Good  Speech:  Clear and Coherent and Normal Rate  Volume:  Normal  Mood:  Euthymic  Affect:  Congruent  Thought Process:  Goal Directed and Descriptions of Associations: Circumstantial  Orientation:  Full (Time, Place, and Person)  Thought Content: Logical   Suicidal Thoughts:  No  Homicidal Thoughts:  No  Memory:  WNL  Judgement:  Good  Insight:  Good  Psychomotor Activity:   wearing a brace right leg  Concentration:  Concentration: Good and Attention Span: Good  Recall:  Good  Fund of Knowledge: Good  Language: Good  Assets:  Desire for Improvement Financial Resources/Insurance Housing Transportation Vocational/Educational  ADL's:  Intact  Cognition: WNL  Prognosis:  Good   DIAGNOSES:    ICD-10-CM   1. Attention deficit hyperactivity disorder (ADHD), combined type  F90.2     2. Bipolar I disorder ([place])  F31.9       Receiving Psychotherapy: No   RECOMMENDATIONS:  PDMP reviewed.  No controlled substances. He is taking Adderall XR  I provided  22mnutes of face to face time during this encounter, including  time spent before and after the visit in records review, medical decision making, counseling pertinent to today's visit, and charting.   We discussed the easy anger, he does not have any other manic symptoms.  I recommend either restarting Depakote or adding Tegretol or Trileptal.  We discussed the benefits, risk, side effects, need for labs routinely.  We decided to start Trileptal.  Continue Adderall XR  30 mg, 1 p.o. every morning. Continue Lamictal 150 mg, 1 qd. Start Trileptal 300 mg, 1 p.o. nightly for 4 days then 1 p.o. twice daily. Return in 4 weeks..Donnal Moat PA-C

## 2022-05-01 ENCOUNTER — Telehealth: Payer: Self-pay | Admitting: Physician Assistant

## 2022-05-01 NOTE — Telephone Encounter (Signed)
Requesting refill on Adderall XR called to:  [id number] [place] [place] - [address]   Phone: [phone] Fax: [phone]   [facility].

## 2022-05-03 NOTE — Telephone Encounter (Signed)
Patient is to check with pharmacy for availability.

## 2022-05-04 ENCOUNTER — Other Ambulatory Visit: Payer: Self-pay

## 2022-05-04 MED ORDER — AMPHETAMINE-DEXTROAMPHET ER 30 MG PO CP24: 30.0000 mg | ORAL_CAPSULE | ORAL | 0 refills | Status: DC

## 2022-05-04 NOTE — Telephone Encounter (Signed)
Pended.

## 2022-05-04 NOTE — Telephone Encounter (Signed)
Patient lvm at 4:14 stating that pharmacy does have Adderall in stock and needs prescription sent to [facility] ASAP Ph: [phone] Appt 3/11

## 2022-05-06 ENCOUNTER — Other Ambulatory Visit: Payer: Self-pay | Admitting: Physician Assistant

## 2022-05-08 NOTE — Telephone Encounter (Signed)
His refill isn't due yet, has apt Monday and can update Rx during visit.

## 2022-05-11 ENCOUNTER — Encounter: Payer: Self-pay | Admitting: Physician Assistant

## 2022-05-11 ENCOUNTER — Ambulatory Visit: Payer: 59 | Admitting: Physician Assistant

## 2022-05-11 DIAGNOSIS — F902 Attention-deficit hyperactivity disorder, combined type: Secondary | ICD-10-CM

## 2022-05-11 DIAGNOSIS — F319 Bipolar disorder, unspecified: Secondary | ICD-10-CM | POA: Diagnosis not present

## 2022-05-11 MED ORDER — AMPHETAMINE-DEXTROAMPHET ER 30 MG PO CP24: ORAL_CAPSULE | ORAL | 0 refills | Status: DC

## 2022-05-11 MED ORDER — OXCARBAZEPINE 300 MG PO TABS: 300.0000 mg | ORAL_TABLET | Freq: Every evening | ORAL | 0 refills | Status: DC

## 2022-05-11 NOTE — Progress Notes (Signed)
[facility]  Patient ID: Stephen Downs,  MRN: [id number]  PCP: Orma Render, NP  Date of Evaluation: 05/11/2022 Time spent:20 minutes  Chief Complaint:  Chief Complaint   Follow-up    HISTORY/CURRENT STATUS: HPI For routine med check.   At the last visit 1 month ago we added Trileptal.  He has responded well, anger and irritability are not as much of an issue now.  He and his wife will be starting couples counseling soon and knows that will help as well.  He did increase the Trileptal to twice daily, however it made him sleepy in the morning so he did not continue the morning dose.  It also caused double vision a couple of times and once he went back to only nighttime dose and that went away.  Patient is able to enjoy things.  Energy and motivation are good.  He is out of work right now due to fractured patella several months ago.  No extreme sadness, tearfulness, or feelings of hopelessness.  Sleeps well most of the time. ADLs and personal hygiene are normal. Appetite has not changed.  Weight is stable.  Gets anxious occasionally but not severe.  Denies suicidal or homicidal thoughts.  The Adderall helps but it does not last more than 4 to 5 hours most of the time.  He has occasionally taken 1 in the afternoon as well as the morning and that does help with focus for the rest of the day.  He does not need it every day.  Patient denies increased energy with decreased need for sleep, increased talkativeness, racing thoughts, impulsivity or risky behaviors, increased spending, increased libido, grandiosity, paranoia, or hallucinations.  Denies dizziness, syncope, seizures, numbness, tingling, tremor, tics, unsteady gait, slurred speech, confusion.  Right knee pain has improved.  Individual Medical History/ Review of Systems: Changes? :No     No psych hospitalizations. No suicide attempts.    Past medications for mental health diagnoses include: Buspar, depakote,  Lamictal, Guanfacine caused dizziness, took Adderall a few times in college from some friends.  Strattera has not been effective at mid range dose.  Allergies: Patient has no known allergies.  Current Medications:  Current Outpatient Medications:    lamoTRIgine (LAMICTAL) 150 MG tablet, Take 1 tablet (150 mg total) by mouth daily., Disp: 90 tablet, Rfl: 1   [START ON 08/04/2022] amphetamine-dextroamphetamine (ADDERALL XR) 30 MG 24 hr capsule, 1 po every morning, and 1 po in the afternoon as needed., Disp: 45 capsule, Rfl: 0   [START ON 07/05/2022] amphetamine-dextroamphetamine (ADDERALL XR) 30 MG 24 hr capsule, 1 q am routinely, and 1 in the afternoon as needed., Disp: 45 capsule, Rfl: 0   [START ON 06/06/2022] amphetamine-dextroamphetamine (ADDERALL XR) 30 MG 24 hr capsule, 1 po q am, routinely, and 1 q afternoon prn., Disp: 45 capsule, Rfl: 0   Oxcarbazepine (TRILEPTAL) 300 MG tablet, Take 1 tablet (300 mg total) by mouth at bedtime., Disp: 90 tablet, Rfl: 0 Medication Side Effects: dizziness/lightheadedness with guanfacine  Family Medical/ Social History: Changes? no  MENTAL HEALTH EXAM:  There were no vitals taken for this visit.There is no height or weight on file to calculate BMI.  General Appearance: Casual and Well Groomed  Eye Contact:  Good  Speech:  Clear and Coherent and Normal Rate  Volume:  Normal  Mood:  Euthymic  Affect:  Congruent  Thought Process:  Goal Directed and Descriptions of Associations: Circumstantial  Orientation:  Full (Time, Place, and Person)  Thought  Content: Logical   Suicidal Thoughts:  No  Homicidal Thoughts:  No  Memory:  WNL  Judgement:  Good  Insight:  Good  Psychomotor Activity:  Normal  Concentration:  Concentration: Good and Attention Span: Fair  Recall:  Good  Fund of Knowledge: Good  Language: Good  Assets:  Communication Skills Desire for Improvement Financial Resources/Insurance Housing Transportation Vocational/Educational  ADL's:   Intact  Cognition: WNL  Prognosis:  Good   DIAGNOSES:    ICD-10-CM   1. Bipolar I disorder ([place])  F31.9     2. Attention deficit hyperactivity disorder (ADHD), combined type  F90.2      Receiving Psychotherapy: No will start soon.  RECOMMENDATIONS:  PDMP reviewed.  No controlled substances. He is taking Adderall XR but is not listed. I provided 20 minutes of face to face time during this encounter, including time spent before and after the visit in records review, medical decision making, counseling pertinent to today's visit, and charting.   The Adderall is not lasting long enough so I recommend he take 1 in the morning routinely and then on some days he can have another pill. May need to increase the dose of Trileptal in the future but now he is doing well so no change will be made.  Continue Adderall XR  30 mg, 1 p.o. every morning and 1 p.o. in the afternoon as needed. Continue Lamictal 150 mg, 1 qd. Continue Trileptal 300 mg, 1 qhs.  Starting therapy soon. Return in 3 months.  Donnal Moat, PA-C

## 2022-06-10 ENCOUNTER — Telehealth: Payer: Self-pay | Admitting: Physician Assistant

## 2022-06-10 NOTE — Telephone Encounter (Signed)
[facility] for Adder[facility] XR 30mg  caps.

## 2022-06-11 NOTE — Telephone Encounter (Signed)
Noted will follow up on

## 2022-06-12 ENCOUNTER — Telehealth: Payer: Self-pay

## 2022-06-12 NOTE — Telephone Encounter (Signed)
Prior Authorization initiated with to [employer] for Amphetamine-dextroamphetamine XR 30 mg #60/30 day, pending response

## 2022-06-29 NOTE — Telephone Encounter (Signed)
Will try to contact [employer] again if determination made on PA [phone]

## 2022-08-11 ENCOUNTER — Encounter: Payer: Self-pay | Admitting: Physician Assistant

## 2022-08-11 ENCOUNTER — Ambulatory Visit (INDEPENDENT_AMBULATORY_CARE_PROVIDER_SITE_OTHER): Payer: 59 | Admitting: Physician Assistant

## 2022-08-11 DIAGNOSIS — F902 Attention-deficit hyperactivity disorder, combined type: Secondary | ICD-10-CM | POA: Diagnosis not present

## 2022-08-11 DIAGNOSIS — F319 Bipolar disorder, unspecified: Secondary | ICD-10-CM

## 2022-08-11 MED ORDER — AMPHETAMINE-DEXTROAMPHETAMINE 10 MG PO TABS: 5.0000 mg | ORAL_TABLET | Freq: Every day | ORAL | 0 refills | Status: DC

## 2022-08-11 MED ORDER — OXCARBAZEPINE 300 MG PO TABS: 300.0000 mg | ORAL_TABLET | Freq: Every evening | ORAL | 0 refills | Status: DC

## 2022-08-11 NOTE — Progress Notes (Unsigned)
[facility]  Patient ID: Rivers Hamrick,  MRN: [id number]  PCP: Tollie Eth, NP  Date of Evaluation: 08/11/2022 Time spent:20 minutes  Chief Complaint:  Chief Complaint   Follow-up    HISTORY/CURRENT STATUS: HPI For routine med check.   He never got the Adderall XR d/t insurance. He feels like maybe it was a good thing b/c he's less irritable without it. Still has trouble focusing though.   He wasn't sure if he was supposed to continue the Lamictal since going on the Trileptal, so he stopped it.  Patient is able to enjoy things.  Energy and motivation are good.  Work is going ok.  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  No extreme sadness, tearfulness, or feelings of hopelessness.  Sleeps well most of the time. ADLs and personal hygiene are normal. Appetite has not changed.  Weight is stable.  No anxiety to speak of.  Denies suicidal or homicidal thoughts.  Patient denies increased energy with decreased need for sleep, increased talkativeness, racing thoughts, impulsivity or risky behaviors, increased spending, increased libido, grandiosity, increased irritability or anger, paranoia, or hallucinations.  Denies dizziness, syncope, seizures, numbness, tingling, tremor, tics, unsteady gait, slurred speech, confusion.  Right knee pain is about the same. Denies unexplained weight loss, frequent infections, or sores that heal slowly.  No polyphagia, polydipsia, or polyuria. Denies visual changes or paresthesias.   Individual Medical History/ Review of Systems: Changes? :No     No psych hospitalizations. No suicide attempts.    Past medications for mental health diagnoses include: Buspar, depakote, Lamictal, Guanfacine caused dizziness, took Adderall a few times in college from some friends.  Strattera has not been effective at mid range dose.  Allergies: Patient has no known allergies.  Current Medications:  Current Outpatient Medications:     amphetamine-dextroamphetamine (ADDERALL) 10 MG tablet, Take 0.5-1 tablets (5-10 mg total) by mouth daily with breakfast., Disp: 30 tablet, Rfl: 0   Oxcarbazepine (TRILEPTAL) 300 MG tablet, Take 1 tablet (300 mg total) by mouth at bedtime., Disp: 90 tablet, Rfl: 0 Medication Side Effects: dizziness/lightheadedness with guanfacine  Family Medical/ Social History: Changes? no  MENTAL HEALTH EXAM:  There were no vitals taken for this visit.There is no height or weight on file to calculate BMI.  General Appearance: Casual and Well Groomed  Eye Contact:  Good  Speech:  Clear and Coherent and Normal Rate  Volume:  Normal  Mood:  Euthymic  Affect:  Congruent  Thought Process:  Goal Directed and Descriptions of Associations: Circumstantial  Orientation:  Full (Time, Place, and Person)  Thought Content: Logical   Suicidal Thoughts:  No  Homicidal Thoughts:  No  Memory:  WNL  Judgement:  Good  Insight:  Good  Psychomotor Activity:  Normal  Concentration:  Concentration: Fair and Attention Span: Fair  Recall:  Good  Fund of Knowledge: Good  Language: Good  Assets:  Communication Skills Desire for Improvement Financial Resources/Insurance Housing Transportation Vocational/Educational  ADL's:  Intact  Cognition: WNL  Prognosis:  Good   DIAGNOSES:    ICD-10-CM   1. Attention deficit hyperactivity disorder (ADHD), combined type  F90.2     2. Bipolar I disorder (HCC)  F31.9       Receiving Psychotherapy: Yes     RECOMMENDATIONS:  PDMP reviewed.  No controlled substances listed. I provided  20  minutes of face to face time during this encounter, including time spent before and after the visit in  records review, medical decision making, counseling pertinent to today's visit, and charting.   Discussed the ADD.  Recommend restarting Adderall but give a lower dose, short acting.  Hopefully that will be just enough to help and not cause irritability.  He would like to try  it.  Start Adderall 10 mg, 1/2-1 p.o. every morning as needed. Continue Trileptal 300 mg, 1 qhs.  Continue therapy.  Return in 4-6 weeks.  Melony Overly, PA-C

## 2022-08-21 ENCOUNTER — Telehealth: Payer: Self-pay

## 2022-08-21 NOTE — Telephone Encounter (Signed)
[facility] did not respond to the PA submitted for Adderall ER 30 mg but pt now is going to try Adderall 10 mg. Hopefully he can get this filled without issues.

## 2022-09-15 ENCOUNTER — Encounter: Payer: Self-pay | Admitting: Physician Assistant

## 2022-09-15 ENCOUNTER — Ambulatory Visit (INDEPENDENT_AMBULATORY_CARE_PROVIDER_SITE_OTHER): Payer: 59 | Admitting: Physician Assistant

## 2022-09-15 DIAGNOSIS — F902 Attention-deficit hyperactivity disorder, combined type: Secondary | ICD-10-CM

## 2022-09-15 DIAGNOSIS — F319 Bipolar disorder, unspecified: Secondary | ICD-10-CM

## 2022-09-15 MED ORDER — AMPHETAMINE-DEXTROAMPHETAMINE 10 MG PO TABS: 5.0000 mg | ORAL_TABLET | Freq: Every day | ORAL | 0 refills | Status: DC

## 2022-09-15 NOTE — Progress Notes (Signed)
[facility]  Patient ID: Stephen Downs,  MRN: [id number]  PCP: Tollie Eth, NP  Date of Evaluation: 09/15/2022 Time spent:20 minutes  Chief Complaint:  Chief Complaint   ADHD; Follow-up    HISTORY/CURRENT STATUS: HPI For routine med check.   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  He would often go into work and then be told to go home, that he was not needed.  States he did not make enough money for the job to be worth it.  He is currently looking for another job.  He was unable to get the Adderall due to finances.  But wants to get started on it now so he will be able to look for a job, it will be easier for him to stay on task.  He feels like his thoughts are scattered all over the place.  Patient is able to enjoy things.  Energy and motivation are good.  He is sleeping well.  No extreme sadness, tearfulness, or feelings of hopelessness.  ADLs and personal hygiene are normal.   Appetite has not changed.  Weight is stable.  No PA or generalized anxiety to speak of.  Denies suicidal or homicidal thoughts.   Patient denies increased energy with decreased need for sleep, increased talkativeness, racing thoughts, impulsivity or risky behaviors, increased spending, increased libido, grandiosity, increased irritability or anger, paranoia, or hallucinations.  Denies dizziness, syncope, seizures, numbness, tingling, tremor, tics, unsteady gait, slurred speech, confusion. Denies muscle or joint pain, stiffness, or dystonia.  Individual Medical History/ Review of Systems: Changes? :No     No psych hospitalizations. No suicide attempts.    Past medications for mental health diagnoses include: Buspar, depakote, Lamictal, Guanfacine caused dizziness, took Adderall a few times in college from some friends.  Strattera has not been effective at mid range dose.  Allergies: Patient has no known allergies.  Current Medications:  Current Outpatient Medications:     amphetamine-dextroamphetamine (ADDERALL) 10 MG tablet, Take 0.5-1 tablets (5-10 mg total) by mouth daily with breakfast., Disp: 30 tablet, Rfl: 0   amphetamine-dextroamphetamine (ADDERALL) 10 MG tablet, Take 0.5-1 tablets (5-10 mg total) by mouth daily with breakfast., Disp: 30 tablet, Rfl: 0   Oxcarbazepine (TRILEPTAL) 300 MG tablet, Take 1 tablet (300 mg total) by mouth at bedtime., Disp: 90 tablet, Rfl: 0 Medication Side Effects: dizziness/lightheadedness with guanfacine  Family Medical/ Social History: Changes? no  MENTAL HEALTH EXAM:  There were no vitals taken for this visit.There is no height or weight on file to calculate BMI.  General Appearance: Casual and Well Groomed  Eye Contact:  Good  Speech:  Clear and Coherent and Normal Rate  Volume:  Normal  Mood:  Euthymic  Affect:  Congruent  Thought Process:  Goal Directed and Descriptions of Associations: Circumstantial  Orientation:  Full (Time, Place, and Person)  Thought Content: Logical   Suicidal Thoughts:  No  Homicidal Thoughts:  No  Memory:  WNL  Judgement:  Good  Insight:  Good  Psychomotor Activity:  Normal  Concentration:  Concentration: Fair and Attention Span: Fair  Recall:  Good  Fund of Knowledge: Good  Language: Good  Assets:  Communication Skills Desire for Improvement Financial Resources/Insurance Housing Transportation  ADL's:  Intact  Cognition: WNL  Prognosis:  Good   DIAGNOSES:    ICD-10-CM   1. Attention deficit hyperactivity disorder (ADHD), combined type  F90.2     2. Bipolar I disorder (HCC)  F31.9  Receiving Psychotherapy: Yes     RECOMMENDATIONS:  PDMP reviewed.  No controlled substances listed. I provided  20  minutes of face to face time during this encounter, including time spent before and after the visit in records review, medical decision making, counseling pertinent to today's visit, and charting.   He wasn't able to start the Adderall so will do so.   Start Adderall  10 mg, 1/2-1 q am daily.  Continue Trileptal 300 mg, 1 qhs.  Continue therapy.  Return in 4 weeks.   Melony Overly, PA-C

## 2022-10-21 ENCOUNTER — Ambulatory Visit: Payer: 59 | Admitting: Physician Assistant

## 2022-10-21 ENCOUNTER — Encounter: Payer: Self-pay | Admitting: Physician Assistant

## 2022-10-21 DIAGNOSIS — F902 Attention-deficit hyperactivity disorder, combined type: Secondary | ICD-10-CM | POA: Diagnosis not present

## 2022-10-21 DIAGNOSIS — F319 Bipolar disorder, unspecified: Secondary | ICD-10-CM | POA: Diagnosis not present

## 2022-10-21 MED ORDER — AMPHETAMINE-DEXTROAMPHETAMINE 15 MG PO TABS: 15.0000 mg | ORAL_TABLET | Freq: Every morning | ORAL | 0 refills | Status: DC

## 2022-10-21 NOTE — Progress Notes (Signed)
[facility]  Patient ID: Stephen Downs,  MRN: [id number]  PCP: Tollie Eth, NP  Date of Evaluation: 10/21/2022 Time spent:20 minutes  Chief Complaint:  Chief Complaint   ADHD; Follow-up    HISTORY/CURRENT STATUS: HPI For routine med check.   We added Adderall at the last visit, and it has been helpful but he ended up taking 15 mg instead of 10 mg on some days.  It was more helpful than just the 10 mg.  He of course did not have enough medication so on the days he did not take it he could tell his concentration was way off.  The extended release made him too wound up but this seems much better.  If it is okay, he would like a prescription for 15 mg pills.  Patient is able to enjoy things.  Energy and motivation are good.  Work is going well.  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  No extreme sadness, tearfulness, or feelings of hopelessness.  Sleeps well most of the time. ADLs and personal hygiene are normal.  Appetite has not changed.  Weight is stable.  No anxiety.  Denies suicidal or homicidal thoughts.  Patient denies increased energy with decreased need for sleep, increased talkativeness, racing thoughts, impulsivity or risky behaviors, increased spending, increased libido, grandiosity, increased irritability or anger, paranoia, or hallucinations.  Denies dizziness, syncope, seizures, numbness, tingling, tremor, tics, unsteady gait, slurred speech, confusion. Denies muscle or joint pain, stiffness, or dystonia.  Individual Medical History/ Review of Systems: Changes? :No     No psych hospitalizations. No suicide attempts.    Past medications for mental health diagnoses include: Buspar, depakote, Lamictal, Guanfacine caused dizziness, took Adderall a few times in college from some friends.  Adderall XR caused him to feel too wound up for too long. Strattera has not been effective at mid range dose.  Allergies: Patient has no  known allergies.  Current Medications:  Current Outpatient Medications:    amphetamine-dextroamphetamine (ADDERALL) 15 MG tablet, Take 1 tablet by mouth in the morning., Disp: 30 tablet, Rfl: 0   [START ON 11/20/2022] amphetamine-dextroamphetamine (ADDERALL) 15 MG tablet, Take 1 tablet by mouth in the morning., Disp: 30 tablet, Rfl: 0   [START ON 12/19/2022] amphetamine-dextroamphetamine (ADDERALL) 15 MG tablet, Take 1 tablet by mouth in the morning., Disp: 30 tablet, Rfl: 0   Oxcarbazepine (TRILEPTAL) 300 MG tablet, Take 1 tablet (300 mg total) by mouth at bedtime., Disp: 90 tablet, Rfl: 0 Medication Side Effects: dizziness/lightheadedness with guanfacine  Family Medical/ Social History: Changes? no  MENTAL HEALTH EXAM:  There were no vitals taken for this visit.There is no height or weight on file to calculate BMI.  General Appearance: Casual and Well Groomed  Eye Contact:  Good  Speech:  Clear and Coherent and Normal Rate  Volume:  Normal  Mood:  Euthymic  Affect:  Congruent  Thought Process:  Goal Directed and Descriptions of Associations: Circumstantial  Orientation:  Full (Time, Place, and Person)  Thought Content: Logical   Suicidal Thoughts:  No  Homicidal Thoughts:  No  Memory:  WNL  Judgement:  Good  Insight:  Good  Psychomotor Activity:  Normal  Concentration:  Concentration: Good and Attention Span: Fair  Recall:  Good  Fund of Knowledge: Good  Language: Good  Assets:  Communication Skills Desire for Improvement Financial Resources/Insurance Housing Transportation  ADL's:  Intact  Cognition: WNL  Prognosis:  Good  DIAGNOSES:    ICD-10-CM   1. Attention deficit hyperactivity disorder (ADHD), combined type  F90.2     2. Bipolar I disorder (HCC)  F31.9       Receiving Psychotherapy: Yes     RECOMMENDATIONS:  PDMP reviewed.  No controlled substances listed. I provided 20 minutes of face to face time during this encounter, including time spent before and  after the visit in records review, medical decision making, counseling pertinent to today's visit, and charting.   He is responding well, it is appropriate to increase the dose.  Continue Adderall 15 mg, 1 po qam.  Continue Trileptal 300 mg, 1 qhs.  Continue therapy.  Return in 3 months.   Melony Overly, PA-C

## 2022-12-31 ENCOUNTER — Telehealth: Payer: Self-pay | Admitting: Physician Assistant

## 2022-12-31 ENCOUNTER — Other Ambulatory Visit: Payer: Self-pay

## 2022-12-31 MED ORDER — AMPHETAMINE-DEXTROAMPHETAMINE 15 MG PO TABS: 15.0000 mg | ORAL_TABLET | Freq: Every morning | ORAL | 0 refills | Status: DC

## 2022-12-31 NOTE — Telephone Encounter (Signed)
Pened

## 2022-12-31 NOTE — Telephone Encounter (Signed)
Patient called in for refill on Adderall 15mg . Ph: [phone] Appt 11/21 [facility]acy [facility] [address] Jacky Kindle

## 2023-01-21 ENCOUNTER — Ambulatory Visit: Payer: 59 | Admitting: Physician Assistant

## 2023-01-21 ENCOUNTER — Encounter: Payer: Self-pay | Admitting: Physician Assistant

## 2023-01-21 DIAGNOSIS — F319 Bipolar disorder, unspecified: Secondary | ICD-10-CM

## 2023-01-21 DIAGNOSIS — F902 Attention-deficit hyperactivity disorder, combined type: Secondary | ICD-10-CM | POA: Diagnosis not present

## 2023-01-21 MED ORDER — OXCARBAZEPINE 300 MG PO TABS: 300.0000 mg | ORAL_TABLET | Freq: Every evening | ORAL | 0 refills | Status: DC

## 2023-01-21 MED ORDER — AMPHETAMINE-DEXTROAMPHETAMINE 20 MG PO TABS: 20.0000 mg | ORAL_TABLET | Freq: Every day | ORAL | 0 refills | Status: DC

## 2023-01-21 NOTE — Progress Notes (Signed)
[facility]  Patient ID: Stephen Downs,  MRN: [id number]  PCP: Tollie Eth, NP  Date of Evaluation:01/21/2023 Time spent: 21 minutes  Chief Complaint:  Chief Complaint   Follow-up    HISTORY/CURRENT STATUS: HPI For routine med check.   The Adderall isn't working as well as it once did. Isn't able to stay on task like he should. Attention span is shorter than it was. Wonders if it's possible to increase the dose.   Patient is able to enjoy things.  Energy and motivation are good.  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  No extreme sadness, tearfulness, or feelings of hopelessness.  Sleeps well most of the time. ADLs and personal hygiene are normal. Appetite has not changed.  Weight is stable.  No c/o anxiety.  Denies suicidal or homicidal thoughts.  Patient denies increased energy with decreased need for sleep, increased talkativeness, racing thoughts, impulsivity or risky behaviors, increased spending, increased libido, grandiosity, increased irritability or anger, paranoia, or hallucinations.  Denies dizziness, syncope, seizures, numbness, tingling, tremor, tics, unsteady gait, slurred speech, confusion. Denies muscle or joint pain, stiffness, or dystonia.  Individual Medical History/ Review of Systems: Changes? :No     No psych hospitalizations. No suicide attempts.    Past medications for mental health diagnoses include: Buspar, depakote, Lamictal, Guanfacine caused dizziness, took Adderall a few times in college from some friends.  Adderall XR caused him to feel too wound up for too long. Strattera has not been effective at mid range dose.  Allergies: Patient has no known allergies.  Current Medications:  Current Outpatient Medications:    amphetamine-dextroamphetamine (ADDERALL) 20 MG tablet, Take 1 tablet (20 mg total) by mouth daily., Disp: 30 tablet, Rfl: 0   Oxcarbazepine (TRILEPTAL) 300 MG tablet, Take 1 tablet (300 mg total) by mouth  at bedtime., Disp: 90 tablet, Rfl: 0 Medication Side Effects: dizziness/lightheadedness with guanfacine  Family Medical/ Social History: Changes? no  MENTAL HEALTH EXAM:  There were no vitals taken for this visit.There is no height or weight on file to calculate BMI.  General Appearance: Casual and Well Groomed  Eye Contact:  Good  Speech:  Clear and Coherent and Normal Rate  Volume:  Normal  Mood:  Euthymic  Affect:  Congruent  Thought Process:  Goal Directed and Descriptions of Associations: Circumstantial  Orientation:  Full (Time, Place, and Person)  Thought Content: Logical   Suicidal Thoughts:  No  Homicidal Thoughts:  No  Memory:  WNL  Judgement:  Good  Insight:  Good  Psychomotor Activity:  Normal  Concentration:  Concentration: Fair and Attention Span: Fair  Recall:  Good  Fund of Knowledge: Good  Language: Good  Assets:  Communication Skills Desire for Improvement Financial Resources/Insurance Housing Transportation  ADL's:  Intact  Cognition: WNL  Prognosis:  Good   DIAGNOSES:    ICD-10-CM   1. Attention deficit hyperactivity disorder (ADHD), combined type  F90.2     2. Bipolar I disorder (HCC)  F31.9      Receiving Psychotherapy: Yes   and marriage counseling  RECOMMENDATIONS:  PDMP reviewed.  No controlled substances listed. I provided 21 minutes of face to face time during this encounter, including time spent before and after the visit in records review, medical decision making, counseling pertinent to today's visit, and charting.   Good luck on the job interview today!  We discussed increasing the Adderall.  It is appropriate to do so.  He is doing  well otherwise so no changes will be made.  He will call in 1 month to let me know how this dose is working and I will send in either 3 more prescriptions of this dose or increase the dose and give only 1 month supply.  He understands.  Increase Adderall to 20 mg, 1 po qam.  Continue Trileptal 300 mg, 1  qhs.  Continue therapy.  Return in 2 months.   Melony Overly, PA-C

## 2023-03-23 ENCOUNTER — Ambulatory Visit: Payer: Self-pay | Admitting: Physician Assistant

## 2023-03-23 DIAGNOSIS — Z91199 Patient's noncompliance with other medical treatment and regimen due to unspecified reason: Secondary | ICD-10-CM

## 2023-03-23 NOTE — Progress Notes (Signed)
No show

## 2023-04-01 ENCOUNTER — Other Ambulatory Visit: Payer: Self-pay

## 2023-04-01 ENCOUNTER — Telehealth: Payer: Self-pay | Admitting: Physician Assistant

## 2023-04-01 MED ORDER — AMPHETAMINE-DEXTROAMPHETAMINE 20 MG PO TABS: 20.0000 mg | ORAL_TABLET | Freq: Every day | ORAL | 0 refills | Status: DC

## 2023-04-01 NOTE — Telephone Encounter (Signed)
Stephen Downs called to RS appt he missed last week.  Now scheduled for 04/05/23.  Requested refill of his Adderall 20mg .  [facility] to  [id number] - [place], [place] - [address]

## 2023-04-01 NOTE — Telephone Encounter (Signed)
Pended adderall 20 mg to [facility].

## 2023-04-05 ENCOUNTER — Ambulatory Visit (INDEPENDENT_AMBULATORY_CARE_PROVIDER_SITE_OTHER): Payer: 59 | Admitting: Physician Assistant

## 2023-04-05 ENCOUNTER — Encounter: Payer: Self-pay | Admitting: Physician Assistant

## 2023-04-05 DIAGNOSIS — F902 Attention-deficit hyperactivity disorder, combined type: Secondary | ICD-10-CM | POA: Diagnosis not present

## 2023-04-05 DIAGNOSIS — F319 Bipolar disorder, unspecified: Secondary | ICD-10-CM

## 2023-04-05 DIAGNOSIS — G4700 Insomnia, unspecified: Secondary | ICD-10-CM

## 2023-04-05 MED ORDER — TRAZODONE HCL 100 MG PO TABS: 50.0000 mg | ORAL_TABLET | Freq: Every day | ORAL | 1 refills | Status: DC

## 2023-04-05 NOTE — Progress Notes (Signed)
[facility]  Patient ID: Anchor Dwan,  MRN: [id number]  PCP: Tollie Eth, NP  Date of Evaluation:04/05/2023 Time spent:20 minutes  Chief Complaint:  Chief Complaint   ADD; Insomnia; Follow-up    HISTORY/CURRENT STATUS: HPI For routine med check.   Adderall is working well.  States that attention is good without easy distractibility.  Able to focus on things and finish tasks to completion.   Not sleeping well. Has tried Melatonin doesn't help.  Hard time falling asleep. Drinks caffeine as late as 7 PM. Once he gets to sleep, he's ok.  Sleeps about 4-5 hours per night.    Patient is able to enjoy things.  Energy and motivation are good.  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  No extreme sadness, tearfulness, or feelings of hopelessness. ADLs and personal hygiene are normal.   Appetite has not changed.  Weight is stable.  Denies suicidal or homicidal thoughts.  Patient denies increased energy with decreased need for sleep, increased talkativeness, racing thoughts, impulsivity or risky behaviors, increased spending, increased libido, grandiosity, increased irritability or anger, paranoia, or hallucinations.  Denies dizziness, syncope, seizures, numbness, tingling, tremor, tics, unsteady gait, slurred speech, confusion. Denies muscle or joint pain, stiffness, or dystonia.  Individual Medical History/ Review of Systems: Changes? :No     No psych hospitalizations. No suicide attempts.    Past medications for mental health diagnoses include: Buspar, depakote, Lamictal, Guanfacine caused dizziness, took Adderall a few times in college from some friends.  Adderall XR caused him to feel too wound up for too long. Strattera has not been effective at mid range dose.  Allergies: Patient has no known allergies.  Current Medications:  Current Outpatient Medications:    amphetamine-dextroamphetamine (ADDERALL) 20 MG tablet, Take 1 tablet (20 mg total) by mouth daily., Disp: 30 tablet,  Rfl: 0   Oxcarbazepine (TRILEPTAL) 300 MG tablet, Take 1 tablet (300 mg total) by mouth at bedtime., Disp: 90 tablet, Rfl: 0   traZODone (DESYREL) 100 MG tablet, Take 0.5-1 tablets (50-100 mg total) by mouth at bedtime., Disp: 30 tablet, Rfl: 1 Medication Side Effects: dizziness/lightheadedness with guanfacine  Family Medical/ Social History: Changes? no  MENTAL HEALTH EXAM:  There were no vitals taken for this visit.There is no height or weight on file to calculate BMI.  General Appearance: Casual and Well Groomed  Eye Contact:  Good  Speech:  Clear and Coherent and Normal Rate  Volume:  Normal  Mood:  Euthymic  Affect:  Congruent  Thought Process:  Goal Directed and Descriptions of Associations: Circumstantial  Orientation:  Full (Time, Place, and Person)  Thought Content: Logical   Suicidal Thoughts:  No  Homicidal Thoughts:  No  Memory:  WNL  Judgement:  Good  Insight:  Good  Psychomotor Activity:  Normal  Concentration:  Concentration: Good and Attention Span: Good  Recall:  Good  Fund of Knowledge: Good  Language: Good  Assets:  Communication Skills Desire for Improvement Financial Resources/Insurance Housing Transportation  ADL's:  Intact  Cognition: WNL  Prognosis:  Good   DIAGNOSES:    ICD-10-CM   1. Attention deficit hyperactivity disorder (ADHD), combined type  F90.2     2. Bipolar I disorder (HCC)  F31.9     3. Insomnia, unspecified type  G47.00      Receiving Psychotherapy: Yes   and marriage counseling  RECOMMENDATIONS:  PDMP reviewed.  No controlled substances listed. I provided 20 minutes of face to face time during this encounter, including  time spent before and after the visit in records review, medical decision making, counseling pertinent to today's visit, and charting.   Sleep hygiene discussed.  Avoid caffeine after 2 PM.  Continue Adderall  20 mg, 1 po qam.  Continue Trileptal 300 mg, 1 qhs.  Start trazodone 100 mg, 1/2-1 nightly as  needed sleep. Continue therapy.  Return in 2 months.   Melony Overly, PA-C

## 2023-04-27 ENCOUNTER — Other Ambulatory Visit: Payer: Self-pay | Admitting: Physician Assistant

## 2023-05-04 ENCOUNTER — Other Ambulatory Visit: Payer: Self-pay

## 2023-05-04 ENCOUNTER — Telehealth: Payer: Self-pay | Admitting: Physician Assistant

## 2023-05-04 MED ORDER — AMPHETAMINE-DEXTROAMPHETAMINE 20 MG PO TABS: 20.0000 mg | ORAL_TABLET | Freq: Every day | ORAL | 0 refills | Status: DC

## 2023-05-04 NOTE — Telephone Encounter (Signed)
 Pt lvm that he needs a refill on his adderall 20 mg. [facility] [address]. Next appt is  in april

## 2023-05-04 NOTE — Telephone Encounter (Signed)
 Pended [facility] [address]

## 2023-05-06 ENCOUNTER — Emergency Department (HOSPITAL_COMMUNITY)
Admission: EM | Admit: 2023-05-06 | Discharge: 2023-05-06 | Disposition: A | Discharge status: Home / Self Care | Attending: Emergency Medicine | Admitting: Emergency Medicine

## 2023-05-06 ENCOUNTER — Other Ambulatory Visit: Payer: Self-pay

## 2023-05-06 ENCOUNTER — Emergency Department (HOSPITAL_COMMUNITY)

## 2023-05-06 DIAGNOSIS — M25571 Pain in right ankle and joints of right foot: Secondary | ICD-10-CM | POA: Diagnosis present

## 2023-05-06 NOTE — ED Notes (Signed)
 Awaiting patient from lobby.

## 2023-05-06 NOTE — ED Triage Notes (Signed)
 Pt. Stated, I stopped fast and it did something to my rt. Foot/ankle. I can't hardly walk on it.

## 2023-05-06 NOTE — ED Provider Notes (Signed)
 [place] EMERGENCY DEPARTMENT AT [facility] Provider Note   CSN: [id number] Arrival date & time: 05/06/23  1478     History Chief Complaint  Patient presents with   Ankle Pain   Foot Pain    HPI Stephen Downs is a 28 y.o. male presenting for foot and ankle pain. Was stomping cardboard box when he lost balance and went through the box putting his heel directly to the hardwood.  Not wearing shoes.  Immediate pain in his right calcaneus.  Ambulatory though with pain.  Denies fevers chills nausea vomiting shortness of breath.   Patient's recorded medical, surgical, social, medication list and allergies were reviewed in the Snapshot window as part of the initial history.   Review of Systems   Review of Systems  Constitutional:  Negative for chills and fever.  HENT:  Negative for ear pain and sore throat.   Eyes:  Negative for pain and visual disturbance.  Respiratory:  Negative for cough and shortness of breath.   Cardiovascular:  Negative for chest pain and palpitations.  Gastrointestinal:  Negative for abdominal pain and vomiting.  Genitourinary:  Negative for dysuria and hematuria.  Musculoskeletal:  Negative for arthralgias and back pain.  Skin:  Negative for color change and rash.  Neurological:  Negative for seizures and syncope.  All other systems reviewed and are negative.   Physical Exam Updated Vital Signs BP 124/72 (BP Location: Right Arm)   Pulse 84   Temp 98 F (36.7 C)   Resp 16   Ht 5\' 10"  (1.778 m)   Wt 64 kg   SpO2 100%   BMI 20.23 kg/m  Physical Exam Vitals and nursing note reviewed.  Constitutional:      General: He is not in acute distress.    Appearance: He is well-developed.  HENT:     Head: Normocephalic and atraumatic.  Eyes:     Conjunctiva/sclera: Conjunctivae normal.  Cardiovascular:     Rate and Rhythm: Normal rate and regular rhythm.  Pulmonary:     Effort: Pulmonary effort is normal. No respiratory distress.   Abdominal:     General: Abdomen is flat. There is no distension.  Musculoskeletal:        General: Signs of injury (Obvious bruising and injury to the right heel.) present. No swelling or deformity.  Skin:    General: Skin is warm and dry.     Capillary Refill: Capillary refill takes less than 2 seconds.  Neurological:     Mental Status: He is alert and oriented to person, place, and time. Mental status is at baseline.      ED Course/ Medical Decision Making/ A&P    Procedures Procedures   Medications Ordered in ED Medications - No data to display  Medical Decision Making:   28 year old male present with orthopedic injury.  His history of present on his physical exam findings are most consistent with likely musculoskeletal strain/sprain. No evidence of acute fracture on x-ray was performed and reviewed. Supportive care reinforced.  Crutches already owned and patient feels comfortable with ambulatory management follow-up with orthopedics.  Disposition:  I have considered need for hospitalization, however, considering all of the above, I believe this patient is stable for discharge at this time.  Patient/family educated about specific return precautions for given chief complaint and symptoms.  Patient/family educated about follow-up with PCP.     Patient/family expressed understanding of return precautions and need for follow-up. Patient spoken to regarding all imaging and  laboratory results and appropriate follow up for these results. All education provided in verbal form with additional information in written form. Time was allowed for answering of patient questions. Patient discharged.    Emergency Department Medication Summary:   Medications - No data to display       Clinical Impression:  1. Acute right ankle pain      Discharge   Final Clinical Impression(s) / ED Diagnoses Final diagnoses:  Acute right ankle pain    Rx / DC Orders ED Discharge Orders      None         Glyn Ade, MD 05/06/23 1406

## 2023-05-07 ENCOUNTER — Ambulatory Visit: Admitting: Family

## 2023-05-07 ENCOUNTER — Encounter: Payer: Self-pay | Admitting: Family

## 2023-05-07 DIAGNOSIS — S92001A Unspecified fracture of right calcaneus, initial encounter for closed fracture: Secondary | ICD-10-CM

## 2023-05-07 NOTE — Progress Notes (Signed)
 Office Visit Note   Patient: Stephen Downs           Date of Birth: September 25, 1995           MRN: [id number] Visit Date: 05/07/2023              Requested by: Tollie Eth, NP [address] [place],  [place] [address] PCP: Tollie Eth, NP  Chief Complaint  Patient presents with   Right Ankle - Pain      HPI: The patient is a 28 year old male who presents today for evaluation of heel pain after stomping on a cardboard box and accidentally lost his balance his foot went through the box and he struck against the hardwood without shoewear.  He has had significant heel pain since he is unable to bear weight due to pain.  Radiographs were performed in the emergency department on March 6 these were unequivocal.  However his tenderness suggests fracture.  Assessment & Plan: Visit Diagnoses: No diagnosis found.  Plan: Offered a cam boot today.  He will continue with crutches nonweightbearing for the next 2 weeks we plan to repeat radiographs in 2 weeks at follow-up.  Follow-Up Instructions: No follow-ups on file.   Right Ankle Exam  Right ankle exam is normal.       Patient is alert, oriented, no adenopathy, well-dressed, normal affect, normal respiratory effort. On examination right foot he does have mild edema with ecchymosis of the heel.  He is exquisitely tender to the posterior heel.  Imaging: No results found. No images are attached to the encounter.  Labs: Lab Results  Component Value Date   HGBA1C 5.1 11/10/2013   ESRSEDRATE 2 09/06/2020     Lab Results  Component Value Date   ALBUMIN 4.9 06/21/2020   ALBUMIN 3.8 11/10/2013    No results found for: "MG" No results found for: "VD25OH"  No results found for: "PREALBUMIN"    Latest Ref Rng & Units 06/21/2020    4:01 PM  CBC EXTENDED  WBC 4.0 - 10.5 K/uL 7.2   RBC 4.22 - 5.81 MIL/uL 5.45   Hemoglobin 13.0 - 17.0 g/dL 19.1   HCT 47.8 - 29.5 % 47.2   Platelets 150 - 400 K/uL 275    NEUT# 1.7 - 7.7 K/uL 3.8   Lymph# 0.7 - 4.0 K/uL 2.8      There is no height or weight on file to calculate BMI.  Orders:  No orders of the defined types were placed in this encounter.  No orders of the defined types were placed in this encounter.    Procedures: No procedures performed  Clinical Data: No additional findings.  ROS:  All other systems negative, except as noted in the HPI. Review of Systems  Objective: Vital Signs: There were no vitals taken for this visit.  Specialty Comments:  No specialty comments available.  PMFS History: Patient Active Problem List   Diagnosis Date Noted   Encounter for annual physical exam 07/13/2020   Attention deficit hyperactivity disorder (ADHD), combined type 07/13/2020   History of concussion 07/13/2020   Bipolar 1 disorder (HCC)    Family history of Charcot-Marie-Tooth disease 06/21/2020   Bilateral numbness and tingling of arms and legs 06/21/2020   Positional lightheadedness 06/21/2020   History of syncope 06/21/2020   Laboratory tests ordered as part of a complete physical exam (CPE) 06/21/2020   Past Medical History:  Diagnosis Date   ADHD    Anxiety  Bilateral numbness and tingling of arms and legs 06/21/2020   Bipolar 1 disorder (HCC)    Encounter to establish care 06/21/2020   Family history of Charcot-Marie-Tooth disease 06/21/2020   History of syncope 06/21/2020   Laboratory tests ordered as part of a complete physical exam (CPE) 06/21/2020   Positional lightheadedness 06/21/2020    Family History  Problem Relation Age of Onset   Diabetes Mother    Diabetes Father    Alcohol abuse Father    Bipolar disorder Father    Stroke Father    Charcot-Marie-Tooth disease Paternal Uncle    Cerebral aneurysm Maternal Grandfather    Aneurysm Maternal Grandfather    Thyroid cancer Maternal Grandmother    Charcot-Marie-Tooth disease Paternal Grandfather    Prostate cancer Maternal Great-grandfather     Past  Surgical History:  Procedure Laterality Date   NO PAST SURGERIES     Social History   Occupational History   Not on file  Tobacco Use   Smoking status: Former   Smokeless tobacco: Never  Vaping Use   Vaping status: Every Day   Substances: Nicotine, CBD  Substance and Sexual Activity   Alcohol use: Yes    Comment: 1-2 beers per month   Drug use: Yes    Types: Marijuana    Comment: 3-4 times per month   Sexual activity: Yes    Birth control/protection: Pill

## 2023-05-21 ENCOUNTER — Ambulatory Visit: Admitting: Family

## 2023-05-21 ENCOUNTER — Other Ambulatory Visit (INDEPENDENT_AMBULATORY_CARE_PROVIDER_SITE_OTHER): Payer: Self-pay

## 2023-05-21 DIAGNOSIS — S92001A Unspecified fracture of right calcaneus, initial encounter for closed fracture: Secondary | ICD-10-CM | POA: Diagnosis not present

## 2023-05-28 ENCOUNTER — Encounter: Payer: Self-pay | Admitting: Family

## 2023-05-28 NOTE — Progress Notes (Signed)
 Office Visit Note   Patient: Stephen Downs           Date of Birth: 1995-05-21           MRN: [id number] Visit Date: 05/21/2023              Requested by: Tollie Eth, NP [address] [place],  [place] [address] PCP: Tollie Eth, NP  Chief Complaint  Patient presents with   Right Ankle - Follow-up      HPI: The patient is a 28 year old male who is seen in follow-up for nondisplaced fracture of the right calcaneus.  He has been able to walk around with crutches and some toe walking.  He feels much better than he did at last visit.  He is trying to increase his weightbearing as he tolerates however he reports he is not putting pressure on his heel he has been doing some PT as well as some home exercise.  Using Tylenol and Advil for pain  Assessment & Plan: Visit Diagnoses:  1. Closed nondisplaced fracture of right calcaneus, unspecified portion of calcaneus, initial encounter     Plan: Plan to follow-up once more in 2 to 3 weeks hope to advance him to regular shoewear at that time  Discussed the importance of nonweightbearing if pain with weightbearing in the calcaneus.  Follow-Up Instructions: No follow-ups on file.   Ortho Exam  Patient is alert, oriented, no adenopathy, well-dressed, normal affect, normal respiratory effort.  On examination right foot ecchymosis present to the plantar aspect of his foot, diffuse.  There is no edema no erythema Imaging: No results found. No images are attached to the encounter.  Labs: Lab Results  Component Value Date   HGBA1C 5.1 11/10/2013   ESRSEDRATE 2 09/06/2020     Lab Results  Component Value Date   ALBUMIN 4.9 06/21/2020   ALBUMIN 3.8 11/10/2013    No results found for: "MG" No results found for: "VD25OH"  No results found for: "PREALBUMIN"    Latest Ref Rng & Units 06/21/2020    4:01 PM  CBC EXTENDED  WBC 4.0 - 10.5 K/uL 7.2   RBC 4.22 - 5.81 MIL/uL 5.45   Hemoglobin 13.0 - 17.0  g/dL 21.3   HCT 08.6 - 57.8 % 47.2   Platelets 150 - 400 K/uL 275   NEUT# 1.7 - 7.7 K/uL 3.8   Lymph# 0.7 - 4.0 K/uL 2.8      There is no height or weight on file to calculate BMI.  Orders:  Orders Placed This Encounter  Procedures   XR Ankle Complete Right   No orders of the defined types were placed in this encounter.    Procedures: No procedures performed  Clinical Data: No additional findings.  ROS:  All other systems negative, except as noted in the HPI. Review of Systems  Objective: Vital Signs: There were no vitals taken for this visit.  Specialty Comments:  No specialty comments available.  PMFS History: Patient Active Problem List   Diagnosis Date Noted   Encounter for annual physical exam 07/13/2020   Attention deficit hyperactivity disorder (ADHD), combined type 07/13/2020   History of concussion 07/13/2020   Bipolar 1 disorder (HCC)    Family history of Charcot-Marie-Tooth disease 06/21/2020   Bilateral numbness and tingling of arms and legs 06/21/2020   Positional lightheadedness 06/21/2020   History of syncope 06/21/2020   Laboratory tests ordered as part of a complete physical exam (CPE) 06/21/2020  Past Medical History:  Diagnosis Date   ADHD    Anxiety    Bilateral numbness and tingling of arms and legs 06/21/2020   Bipolar 1 disorder (HCC)    Encounter to establish care 06/21/2020   Family history of Charcot-Marie-Tooth disease 06/21/2020   History of syncope 06/21/2020   Laboratory tests ordered as part of a complete physical exam (CPE) 06/21/2020   Positional lightheadedness 06/21/2020    Family History  Problem Relation Age of Onset   Diabetes Mother    Diabetes Father    Alcohol abuse Father    Bipolar disorder Father    Stroke Father    Charcot-Marie-Tooth disease Paternal Uncle    Cerebral aneurysm Maternal Grandfather    Aneurysm Maternal Grandfather    Thyroid cancer Maternal Grandmother    Charcot-Marie-Tooth disease  Paternal Grandfather    Prostate cancer Maternal Great-grandfather     Past Surgical History:  Procedure Laterality Date   NO PAST SURGERIES     Social History   Occupational History   Not on file  Tobacco Use   Smoking status: Former   Smokeless tobacco: Never  Vaping Use   Vaping status: Every Day   Substances: Nicotine, CBD  Substance and Sexual Activity   Alcohol use: Yes    Comment: 1-2 beers per month   Drug use: Yes    Types: Marijuana    Comment: 3-4 times per month   Sexual activity: Yes    Birth control/protection: Pill

## 2023-06-07 ENCOUNTER — Ambulatory Visit (INDEPENDENT_AMBULATORY_CARE_PROVIDER_SITE_OTHER): Payer: 59 | Admitting: Physician Assistant

## 2023-06-07 ENCOUNTER — Telehealth: Payer: Self-pay | Admitting: Physician Assistant

## 2023-06-07 NOTE — Telephone Encounter (Signed)
 Mom called AT 11:36 . Stated pt late due to weather and can not wait until June for apt with [facility]. He is not doing good. Having a lot of out burst. Please RTC @ [phone]

## 2023-06-08 ENCOUNTER — Ambulatory Visit: Payer: Self-pay | Admitting: Physician Assistant

## 2023-06-08 ENCOUNTER — Encounter: Payer: Self-pay | Admitting: Physician Assistant

## 2023-06-08 ENCOUNTER — Ambulatory Visit: Admitting: Family

## 2023-06-08 DIAGNOSIS — F319 Bipolar disorder, unspecified: Secondary | ICD-10-CM | POA: Diagnosis not present

## 2023-06-08 DIAGNOSIS — R454 Irritability and anger: Secondary | ICD-10-CM

## 2023-06-08 DIAGNOSIS — F902 Attention-deficit hyperactivity disorder, combined type: Secondary | ICD-10-CM | POA: Diagnosis not present

## 2023-06-08 MED ORDER — OXCARBAZEPINE 600 MG PO TABS: 600.0000 mg | ORAL_TABLET | Freq: Every evening | ORAL | 1 refills | Status: DC

## 2023-06-08 NOTE — Progress Notes (Signed)
 [facility] Med [facility]  Patient ID: Stephen Downs,  MRN: [id number]  PCP: Annella Kief, NP  Date of Evaluation: 06/08/2023 Time spent:25 minutes  Chief Complaint:  Chief Complaint   Stress; Insomnia    HISTORY/CURRENT STATUS: HPI For routine med check.   States his parents wanted him to ask about being tested for autism or intermittent explosive d/o. States he doesn't interact with people at work like his coworkers do. He isolates some. He states he gets angry easily. Has been an issue for 'awhile.' He has punched holes in the wall a few times, a few year ago, he had a boxers fracture b/c of it. He gets mad about little things, like the dishes or other things in the house, certain smells or noises can make him angry.   He's never hit his wife or anyone else.    Patient is able to enjoy things.  Energy and motivation are good.  Work is fine.   No extreme sadness, tearfulness, or feelings of hopelessness.  Sleeps well most of the time.  Does use trazodone sometimes.  ADLs and personal hygiene are normal.   Denies any changes in concentration, making decisions, or remembering things.  Appetite has not changed.  Weight is stable.  No anxiety reported but does get overwhelmed easily.  Denies suicidal or homicidal thoughts.  Patient denies increased energy with decreased need for sleep, increased talkativeness, racing thoughts, impulsivity or risky behaviors, increased spending, increased libido, grandiosity, paranoia, or hallucinations.  Denies dizziness, syncope, seizures, numbness, tingling, tremor, tics, unsteady gait, slurred speech, confusion. Denies muscle or joint pain, stiffness, or dystonia.  Individual Medical History/ Review of Systems: Changes? :No     No psych hospitalizations. No suicide attempts.    Past medications for mental health diagnoses include: Buspar, depakote, Lamictal, Guanfacine caused dizziness, took Adderall a few times in college from some friends.   Adderall XR caused him to feel too wound up for too long. Strattera has not been effective at mid range dose, Trazodone caused morning sedation which wasn't tolerable.  Allergies: Patient has no known allergies.  Current Medications:  Current Outpatient Medications:    amphetamine-dextroamphetamine (ADDERALL) 20 MG tablet, Take 1 tablet (20 mg total) by mouth daily., Disp: 30 tablet, Rfl: 0   amphetamine-dextroamphetamine (ADDERALL) 20 MG tablet, Take 1 tablet (20 mg total) by mouth daily., Disp: 30 tablet, Rfl: 0   oxcarbazepine (TRILEPTAL) 600 MG tablet, Take 1 tablet (600 mg total) by mouth at bedtime., Disp: 30 tablet, Rfl: 1   traZODone (DESYREL) 100 MG tablet, TAKE 0.5-1 TABLETS (50-100 MG TOTAL) BY MOUTH AT BEDTIME., Disp: 90 tablet, Rfl: 1 Medication Side Effects: dizziness/lightheadedness with guanfacine  Family Medical/ Social History: Changes? Grandfather died 05-08-23.   MENTAL HEALTH EXAM:  There were no vitals taken for this visit.There is no height or weight on file to calculate BMI.  General Appearance: Casual and Well Groomed  Eye Contact:  Good  Speech:  Clear and Coherent and Normal Rate  Volume:  Normal  Mood:  Euthymic  Affect:  Congruent  Thought Process:  Goal Directed and Descriptions of Associations: Circumstantial  Orientation:  Full (Time, Place, and Person)  Thought Content: Logical   Suicidal Thoughts:  No  Homicidal Thoughts:  No  Memory:  WNL  Judgement:  Good  Insight:  Good  Psychomotor Activity:  Normal  Concentration:  Concentration: Good and Attention Span: Good  Recall:  Good  Fund of Knowledge: Good  Language: Good  Assets:  Communication Skills Desire for Improvement Financial Resources/Insurance Housing Transportation  ADL's:  Intact  Cognition: WNL  Prognosis:  Good   DIAGNOSES:    ICD-10-CM   1. Attention deficit hyperactivity disorder (ADHD), combined type  F90.2     2. Bipolar I disorder (HCC)  F31.9     3. Irritability  and anger  R45.4       Receiving Psychotherapy: Yes   and marriage counseling  RECOMMENDATIONS:  PDMP reviewed.  Adderall filled 05/15/2023. I provided 25 minutes of face to face time during this encounter, including time spent before and after the visit in records review, medical decision making, counseling pertinent to today's visit, and charting.   Recommend he go to [facility] clinic, [facility] [place] [facility], or mood treatment center for further evaluation and neuropsychological testing as deemed appropriate.  He is aware there is no specific test for autism.  He needs to discuss the anger and irritability with his therapist.  I will increase the Trileptal dose which should help that.  Continue Adderall  20 mg, 1 po qam.  Increase Trileptal to 600 mg p.o. nightly. Continue trazodone 100 mg, 1/2-1 nightly as needed sleep. Continue therapy.  Return in 6 to 8 weeks.  Marvia Slocumb, PA-C

## 2023-06-08 NOTE — Telephone Encounter (Signed)
 Pt has apt today.

## 2023-06-09 ENCOUNTER — Ambulatory Visit: Admitting: Family

## 2023-06-22 ENCOUNTER — Ambulatory Visit: Payer: Self-pay | Admitting: Physician Assistant

## 2023-07-29 ENCOUNTER — Telehealth: Payer: Self-pay | Admitting: Physician Assistant

## 2023-07-29 ENCOUNTER — Other Ambulatory Visit: Payer: Self-pay

## 2023-07-29 MED ORDER — AMPHETAMINE-DEXTROAMPHETAMINE 20 MG PO TABS: 20.0000 mg | ORAL_TABLET | Freq: Every day | ORAL | 0 refills | Status: DC

## 2023-07-29 NOTE — Telephone Encounter (Signed)
 Pended Adderall 20 mg to [facility] on [address].

## 2023-07-29 NOTE — Telephone Encounter (Signed)
 Next visit is 08/05/23. Stephen Downs is requesting a refill on his Adderall called to:  [id number] - [place], [place] - [address]   Phone: [phone]  Fax: [phone]

## 2023-08-05 ENCOUNTER — Ambulatory Visit: Payer: Self-pay | Admitting: Physician Assistant

## 2023-08-05 DIAGNOSIS — Z91199 Patient's noncompliance with other medical treatment and regimen due to unspecified reason: Secondary | ICD-10-CM

## 2023-08-05 NOTE — Progress Notes (Signed)
 No show

## 2023-10-01 ENCOUNTER — Other Ambulatory Visit: Payer: Self-pay

## 2023-10-01 ENCOUNTER — Telehealth: Payer: Self-pay | Admitting: Physician Assistant

## 2023-10-01 MED ORDER — OXCARBAZEPINE 600 MG PO TABS: 600.0000 mg | ORAL_TABLET | Freq: Every evening | ORAL | 0 refills | Status: DC

## 2023-10-01 MED ORDER — AMPHETAMINE-DEXTROAMPHETAMINE 20 MG PO TABS: 20.0000 mg | ORAL_TABLET | Freq: Every day | ORAL | 0 refills | Status: DC

## 2023-10-01 NOTE — Telephone Encounter (Signed)
 Pt called requesting Rx Oxcarbazepine  & Adderall  [facility] [address] [facility]

## 2023-10-01 NOTE — Telephone Encounter (Signed)
 Rx for oxcarbazepine  sent, pended [facility]derall to [facility] on [address].

## 2023-10-04 ENCOUNTER — Ambulatory Visit (INDEPENDENT_AMBULATORY_CARE_PROVIDER_SITE_OTHER): Admitting: Physician Assistant

## 2023-10-04 ENCOUNTER — Encounter: Payer: Self-pay | Admitting: Physician Assistant

## 2023-10-04 DIAGNOSIS — G4700 Insomnia, unspecified: Secondary | ICD-10-CM

## 2023-10-04 DIAGNOSIS — F902 Attention-deficit hyperactivity disorder, combined type: Secondary | ICD-10-CM

## 2023-10-04 DIAGNOSIS — F319 Bipolar disorder, unspecified: Secondary | ICD-10-CM

## 2023-10-04 MED ORDER — AMPHETAMINE-DEXTROAMPHETAMINE 20 MG PO TABS: 20.0000 mg | ORAL_TABLET | Freq: Every day | ORAL | 0 refills | Status: DC

## 2023-10-04 MED ORDER — OXCARBAZEPINE 600 MG PO TABS: 600.0000 mg | ORAL_TABLET | Freq: Every evening | ORAL | 0 refills | Status: DC

## 2023-10-04 NOTE — Progress Notes (Signed)
 [facility]  Patient ID: Stephen Downs,  MRN: [id number]  PCP: Oris Camie BRAVO, NP  Date of Evaluation: 10/04/2023 Time spent:20 minutes  Chief Complaint:  Chief Complaint   ADHD; Insomnia; Follow-up    HISTORY/CURRENT STATUS: HPI For routine med check.   Has been doing pretty well as far as mood goes. The irritability and anger are more under his control, he's trying to ID triggers and think before he speaks or acts.   The Adderall is still effective.  He had to split them in half some of the time recently until he could get into this appointment.  States he could really tell how much he needed it when he did not have it.   Patient is able to enjoy things.  Energy and motivation are good.  Work is going well.  No extreme sadness, tearfulness, or feelings of hopelessness.  Sleeps well.  Uses CBD gummies sometimes to go to sleep.  Works better than Materials engineer. ADLs and personal hygiene are normal.  Appetite has not changed.  No anx.  No SI/HI.  No reports of increased energy with decreased need for sleep, increased talkativeness, racing thoughts, impulsivity or risky behaviors, increased spending, grandiosity, increased irritability or anger, paranoia, or hallucinations.  Individual Medical History/ Review of Systems: Changes? :Yes    had wisdom teeth removed recently.   No psych hospitalizations. No suicide attempts.    Past medications for mental health diagnoses include: Buspar, depakote, Lamictal , Guanfacine  caused dizziness, took Adderall a few times in college from some friends.  Adderall XR caused him to feel too wound up for too long. Strattera  has not been effective at mid range dose, Trazodone  caused morning sedation which wasn't tolerable.  Allergies: Patient has no known allergies.  Current Medications:  Current Outpatient Medications:    [START ON 10/30/2023] amphetamine -dextroamphetamine  (ADDERALL) 20 MG tablet, Take 1 tablet (20 mg total) by mouth  daily., Disp: 30 tablet, Rfl: 0   traZODone  (DESYREL ) 100 MG tablet, TAKE 0.5-1 TABLETS (50-100 MG TOTAL) BY MOUTH AT BEDTIME., Disp: 90 tablet, Rfl: 1   [START ON 11/29/2023] amphetamine -dextroamphetamine  (ADDERALL) 20 MG tablet, Take 1 tablet (20 mg total) by mouth daily., Disp: 30 tablet, Rfl: 0   [START ON 12/28/2023] amphetamine -dextroamphetamine  (ADDERALL) 20 MG tablet, Take 1 tablet (20 mg total) by mouth daily., Disp: 30 tablet, Rfl: 0   oxcarbazepine  (TRILEPTAL ) 600 MG tablet, Take 1 tablet (600 mg total) by mouth at bedtime., Disp: 30 tablet, Rfl: 0 Medication Side Effects: dizziness/lightheadedness with guanfacine   Family Medical/ 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  MENTAL HEALTH EXAM:  There were no vitals taken for this visit.There is no height or weight on file to calculate BMI.  General Appearance: Casual and Well Groomed  Eye Contact:  Good  Speech:  Clear and Coherent and Normal Rate  Volume:  Normal  Mood:  Euthymic  Affect:  Congruent  Thought Process:  Goal Directed and Descriptions of Associations: Circumstantial  Orientation:  Full (Time, Place, and Person)  Thought Content: Logical   Suicidal Thoughts:  No  Homicidal Thoughts:  No  Memory:  WNL  Judgement:  Good  Insight:  Good  Psychomotor Activity:  Normal  Concentration:  Concentration: Good and Attention Span: Good  Recall:  Good  Fund of Knowledge: Good  Language: Good  Assets:  Communication Skills Desire for Improvement Financial Resources/Insurance Housing Transportation Vocational/Educational  ADL's:  Intact  Cognition: WNL  Prognosis:  Good  DIAGNOSES:    ICD-10-CM   1. Bipolar I disorder (HCC)  F31.9     2. Attention deficit hyperactivity disorder (ADHD), combined type  F90.2     3. Insomnia, unspecified type  G47.00      Receiving Psychotherapy: Yes   and marriage counseling  RECOMMENDATIONS:  PDMP reviewed.  Adderall filled 10/01/2023. I provided  approximately 20 minutes of face to face time during this encounter, including time spent before and after the visit in records review, medical decision making, counseling pertinent to today's visit, and charting.   He is doing well on current treatment so no changes need to be made.  Continue Adderall  20 mg, 1 po qam.  Continue Trileptal   600 mg p.o. nightly. Continue trazodone  100 mg, 1/2-1 nightly as needed sleep. He rarely takes.  Continue therapy.  Return in 3 months.   Verneita Cooks, PA-C

## 2024-01-03 ENCOUNTER — Encounter: Payer: Self-pay | Admitting: Radiology

## 2024-01-05 ENCOUNTER — Ambulatory Visit (INDEPENDENT_AMBULATORY_CARE_PROVIDER_SITE_OTHER): Payer: Self-pay | Admitting: Physician Assistant

## 2024-01-05 ENCOUNTER — Encounter: Payer: Self-pay | Admitting: Physician Assistant

## 2024-01-05 DIAGNOSIS — F319 Bipolar disorder, unspecified: Secondary | ICD-10-CM

## 2024-01-05 DIAGNOSIS — R454 Irritability and anger: Secondary | ICD-10-CM

## 2024-01-05 DIAGNOSIS — F902 Attention-deficit hyperactivity disorder, combined type: Secondary | ICD-10-CM

## 2024-01-05 MED ORDER — AMPHETAMINE-DEXTROAMPHETAMINE 30 MG PO TABS: 30.0000 mg | ORAL_TABLET | Freq: Every day | ORAL | 0 refills | Status: DC

## 2024-01-05 MED ORDER — OXCARBAZEPINE 300 MG PO TABS: 300.0000 mg | ORAL_TABLET | Freq: Two times a day (BID) | ORAL | 1 refills | Status: DC

## 2024-01-05 NOTE — Progress Notes (Signed)
 [facility]  Patient ID: Stephen Downs,  MRN: [id number]  PCP: Oris Camie BRAVO, NP  Date of Evaluation: 01/05/2024 Time spent:20 minutes  Chief Complaint:  Chief Complaint   Follow-up    HISTORY/CURRENT STATUS: HPI For routine med check. Accompanied by his wife Lexie.   Adderall isn't working as well as it did. He took 30 mg a few mornings and it helped more.   More irritable in the mornings. Feels it more so when he doesn't get enough sleep.  After he gets up and goes to work, the irritability improves.  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  Has been slightly more impulsive. Patient denies increased energy with decreased need for sleep, increased talkativeness, racing thoughts,  increased spending, increased libido, grandiosity, paranoia, or hallucinations.  No extreme sadness, tearfulness, or feelings of hopelessness.  No PA.  ADLs and personal hygiene are normal.   Appetite has not changed.  Weight is stable.  No SI/HI.  Individual Medical History/ Review of Systems: Changes? :No      No psych hospitalizations. No suicide attempts.    Past medications for mental health diagnoses include: Buspar, depakote, Lamictal , Guanfacine  caused dizziness, took Adderall a few times in college from some friends.  Adderall XR caused him to feel too wound up for too long. Strattera  has not been effective at mid range dose, Trazodone  caused morning sedation which wasn't tolerable.  Allergies: Patient has no known allergies.  Current Medications:  Current Outpatient Medications:    amphetamine -dextroamphetamine  (ADDERALL) 30 MG tablet, Take 1 tablet by mouth daily., Disp: 30 tablet, Rfl: 0   Oxcarbazepine  (TRILEPTAL ) 300 MG tablet, Take 1 tablet (300 mg total) by mouth 2 (two) times daily., Disp: 60 tablet, Rfl: 1   traZODone  (DESYREL ) 100 MG tablet, TAKE 0.5-1 TABLETS (50-100 MG TOTAL) BY MOUTH AT BEDTIME. 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  MENTAL HEALTH EXAM:  There were no vitals taken for this visit.There is no height or weight on file to calculate BMI.  General Appearance: Casual and Well Groomed  Eye Contact:  Good  Speech:  Clear and Coherent and Normal Rate  Volume:  Normal  Mood:  Euthymic  Affect:  Congruent  Thought Process:  Goal Directed and Descriptions of Associations: Circumstantial  Orientation:  Full (Time, Place, and Person)  Thought Content: Logical   Suicidal Thoughts:  No  Homicidal Thoughts:  No  Memory:  WNL  Judgement:  Good  Insight:  Good  Psychomotor Activity:  Normal  Concentration:  Concentration: Good and Attention Span: Fair  Recall:  Good  Fund of Knowledge: Good  Language: Good  Assets:  Communication Skills Desire for Improvement Financial Resources/Insurance Housing Transportation Vocational/Educational  ADL's:  Intact  Cognition: WNL  Prognosis:  Good   DIAGNOSES:    ICD-10-CM   1. Bipolar I disorder (HCC)  F31.9     2. Irritability and anger  R45.4     3. Attention deficit hyperactivity disorder (ADHD), combined type  F90.2       Receiving Psychotherapy: Yes   and marriage counseling  RECOMMENDATIONS:  PDMP reviewed.  Adderall filled 11/13/2023. I provided approximately  20 minutes of face to face time during this encounter, including time spent before and after the visit in  records review, medical decision making, counseling pertinent to today's visit, and charting.   Disc sx and tx options.   Increase Adderall  to 30 mg, 1 po qam.  Change Trileptal   to 300 mg bid.  Continue trazodone  100 mg, 1/2-1 nightly as needed sleep. He rarely takes.  Continue therapy.  Return in 4-6 weeks.  Verneita Cooks, PA-C

## 2024-02-04 ENCOUNTER — Ambulatory Visit (INDEPENDENT_AMBULATORY_CARE_PROVIDER_SITE_OTHER): Payer: Self-pay | Admitting: Physician Assistant

## 2024-02-04 NOTE — Progress Notes (Signed)
 No show

## 2024-02-18 ENCOUNTER — Other Ambulatory Visit: Payer: Self-pay

## 2024-02-18 ENCOUNTER — Telehealth: Payer: Self-pay | Admitting: Physician Assistant

## 2024-02-18 DIAGNOSIS — F902 Attention-deficit hyperactivity disorder, combined type: Secondary | ICD-10-CM

## 2024-02-18 MED ORDER — AMPHETAMINE-DEXTROAMPHETAMINE 30 MG PO TABS: 30.0000 mg | ORAL_TABLET | Freq: Every day | ORAL | 0 refills | Status: DC

## 2024-02-18 NOTE — Telephone Encounter (Signed)
Pended 2 RF.

## 2024-02-18 NOTE — Telephone Encounter (Signed)
 Pt called ro r/s appt and get rf of Adderall   [facility] [address]

## 2024-03-20 ENCOUNTER — Encounter: Payer: Self-pay | Admitting: Physician Assistant

## 2024-03-20 ENCOUNTER — Ambulatory Visit: Payer: Self-pay | Admitting: Physician Assistant

## 2024-03-20 DIAGNOSIS — F319 Bipolar disorder, unspecified: Secondary | ICD-10-CM

## 2024-03-20 DIAGNOSIS — G4700 Insomnia, unspecified: Secondary | ICD-10-CM

## 2024-03-20 DIAGNOSIS — F902 Attention-deficit hyperactivity disorder, combined type: Secondary | ICD-10-CM

## 2024-03-20 MED ORDER — AMPHETAMINE-DEXTROAMPHETAMINE 30 MG PO TABS: 30.0000 mg | ORAL_TABLET | Freq: Every day | ORAL | 0 refills | Status: AC

## 2024-03-20 MED ORDER — OXCARBAZEPINE 300 MG PO TABS: 300.0000 mg | ORAL_TABLET | Freq: Two times a day (BID) | ORAL | 1 refills | Status: AC

## 2024-03-20 NOTE — Progress Notes (Signed)
 "     [facility]  Patient ID: Stephen Downs,  MRN: [id number]  PCP: Oris Camie BRAVO, NP  Date of Evaluation: 03/20/2024 Time spent:20 minutes  Chief Complaint:  Chief Complaint   ADHD; Insomnia; Depression; Follow-up    HISTORY/CURRENT STATUS: HPI For routine med check.   He and his wife separated a few weeks ago.  She moved out. It's not what he wanted but it was a toxic relationship and feels like it the best thing right now.  He feels like his meds are working well. We increased the Adderall at the LOV.  States that attention is good without easy distractibility.  Able to focus on things and finish tasks to completion.  Energy and motivation are good.  Work is going well.   He is sad of course but has no feelings of hopelessness.  Sleeps well most of the time. ADLs and personal hygiene are normal.   Appetite has not changed.  Weight is stable.  No extreme anxiety.  No SI/HI.  No reports of increased energy with decreased need for sleep, increased talkativeness, racing thoughts, impulsivity or risky behaviors, increased spending, grandiosity, increased irritability or anger, paranoia, or hallucinations.  Individual Medical History/ Review of Systems: Changes? :No      No psych hospitalizations. No suicide attempts.    Past medications for mental health diagnoses include: Buspar, depakote, Lamictal , Guanfacine  caused dizziness, took Adderall a few times in college from some friends.  Adderall XR caused him to feel too wound up for too long. Strattera  has not been effective at mid range dose, Trazodone  caused morning sedation which wasn't tolerable.  Allergies: Patient has no known allergies.  Current Medications:  Current Outpatient Medications:    amphetamine -dextroamphetamine  (ADDERALL) 30 MG tablet, Take 1 tablet by mouth daily., Disp: 30 tablet, Rfl: 0   traZODone  (DESYREL ) 100 MG tablet, TAKE 0.5-1 TABLETS (50-100 MG TOTAL) BY MOUTH AT BEDTIME., Disp: 90  tablet, Rfl: 1   [START ON 04/19/2024] amphetamine -dextroamphetamine  (ADDERALL) 30 MG tablet, Take 1 tablet by mouth daily., Disp: 30 tablet, Rfl: 0   [START ON 05/16/2024] amphetamine -dextroamphetamine  (ADDERALL) 30 MG tablet, Take 1 tablet by mouth daily., Disp: 30 tablet, Rfl: 0   Oxcarbazepine  (TRILEPTAL ) 300 MG tablet, Take 1 tablet (300 mg total) by mouth 2 (two) times daily., Disp: 180 tablet, Rfl: 1 Medication Side Effects: dizziness/lightheadedness with guanfacine   Family Medical/ Social History: Changes?  He and wife separated a few weeks ago.  MENTAL HEALTH EXAM:  There were no vitals taken for this visit.There is no height or weight on file to calculate BMI.  General Appearance: Casual and Well Groomed  Eye Contact:  Good  Speech:  Clear and Coherent and Normal Rate  Volume:  Normal  Mood:  sad  Affect:  Congruent  Thought Process:  Goal Directed and Descriptions of Associations: Circumstantial  Orientation:  Full (Time, Place, and Person)  Thought Content: Logical   Suicidal Thoughts:  No  Homicidal Thoughts:  No  Memory:  WNL  Judgement:  Good  Insight:  Good  Psychomotor Activity:  Normal  Concentration:  Concentration: Good and Attention Span: Good  Recall:  Good  Fund of Knowledge: Good  Language: Good  Assets:  Communication Skills Desire for Improvement Financial Resources/Insurance Housing Transportation Vocational/Educational  ADL's:  Intact  Cognition: WNL  Prognosis:  Good   DIAGNOSES:    ICD-10-CM   1. Bipolar I disorder (HCC)  F31.9     2. Attention deficit  hyperactivity disorder (ADHD), combined type  F90.2 amphetamine -dextroamphetamine  (ADDERALL) 30 MG tablet    amphetamine -dextroamphetamine  (ADDERALL) 30 MG tablet    3. Insomnia, unspecified type  G47.00       Receiving Psychotherapy: Yes      RECOMMENDATIONS:  PDMP reviewed.  Adderall filled 01/05/2024. I provided approximately  20 minutes of face to face time during this encounter,  including time spent before and after the visit in records review, medical decision making, counseling pertinent to today's visit, and charting.   I am sorry to hear about his separation.  As far as his medications are concerned he is doing well so no changes will be made.  Continue Adderall  30 mg, 1 po qam.  Change Trileptal   to 300 mg bid.  Continue trazodone  100 mg, 1/2-1 nightly as needed sleep. He rarely takes.  Continue therapy.  Return in 3 months.  Verneita Cooks, PA-C  "

## 2024-06-19 ENCOUNTER — Ambulatory Visit: Payer: Self-pay | Admitting: Physician Assistant
